# Patient Record
Sex: Female | Born: 1960 | Race: White | Hispanic: No | Marital: Married | Smoking: Former smoker
Health system: Southern US, Community
[De-identification: ages and names within clinical notes are randomized; demographics above are authoritative.]

## PROBLEM LIST (undated history)

## (undated) DIAGNOSIS — M5136 Other intervertebral disc degeneration, lumbar region: Secondary | ICD-10-CM

## (undated) DIAGNOSIS — E559 Vitamin D deficiency, unspecified: Secondary | ICD-10-CM

## (undated) DIAGNOSIS — R232 Flushing: Secondary | ICD-10-CM

## (undated) DIAGNOSIS — Z8739 Personal history of other diseases of the musculoskeletal system and connective tissue: Secondary | ICD-10-CM

## (undated) DIAGNOSIS — M51369 Other intervertebral disc degeneration, lumbar region without mention of lumbar back pain or lower extremity pain: Secondary | ICD-10-CM

## (undated) DIAGNOSIS — Z8742 Personal history of other diseases of the female genital tract: Secondary | ICD-10-CM

## (undated) DIAGNOSIS — Z87891 Personal history of nicotine dependence: Secondary | ICD-10-CM

## (undated) HISTORY — DX: Other intervertebral disc degeneration, lumbar region: M51.36

## (undated) HISTORY — DX: Flushing: R23.2

## (undated) HISTORY — DX: Other intervertebral disc degeneration, lumbar region without mention of lumbar back pain or lower extremity pain: M51.369

## (undated) HISTORY — DX: Vitamin D deficiency, unspecified: E55.9

## (undated) HISTORY — DX: Personal history of other diseases of the musculoskeletal system and connective tissue: Z87.39

## (undated) HISTORY — DX: Personal history of other diseases of the female genital tract: Z87.42

---
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
## 1998-05-22 ENCOUNTER — Other Ambulatory Visit: Admission: RE | Admit: 1998-05-22 | Discharge: 1998-05-22 | Payer: Self-pay | Admitting: *Deleted

## 1999-10-07 ENCOUNTER — Other Ambulatory Visit: Admission: RE | Admit: 1999-10-07 | Discharge: 1999-10-07 | Payer: Self-pay | Admitting: *Deleted

## 2000-11-30 ENCOUNTER — Other Ambulatory Visit: Admission: RE | Admit: 2000-11-30 | Discharge: 2000-11-30 | Payer: Self-pay | Admitting: *Deleted

## 2001-12-20 ENCOUNTER — Other Ambulatory Visit: Admission: RE | Admit: 2001-12-20 | Discharge: 2001-12-20 | Payer: Self-pay | Admitting: *Deleted

## 2002-10-04 ENCOUNTER — Observation Stay (HOSPITAL_COMMUNITY): Admission: RE | Admit: 2002-10-04 | Discharge: 2002-10-05 | Payer: Self-pay | Admitting: General Surgery

## 2002-10-04 ENCOUNTER — Encounter: Payer: Self-pay | Admitting: General Surgery

## 2002-11-17 HISTORY — PX: LAPAROSCOPIC CHOLECYSTECTOMY: SUR755

## 2003-01-02 ENCOUNTER — Other Ambulatory Visit: Admission: RE | Admit: 2003-01-02 | Discharge: 2003-01-02 | Payer: Self-pay | Admitting: *Deleted

## 2004-02-14 ENCOUNTER — Other Ambulatory Visit: Admission: RE | Admit: 2004-02-14 | Discharge: 2004-02-14 | Payer: Self-pay | Admitting: *Deleted

## 2005-06-02 ENCOUNTER — Other Ambulatory Visit: Admission: RE | Admit: 2005-06-02 | Discharge: 2005-06-02 | Payer: Self-pay | Admitting: *Deleted

## 2011-01-23 ENCOUNTER — Other Ambulatory Visit: Payer: Self-pay | Admitting: Obstetrics & Gynecology

## 2011-01-23 DIAGNOSIS — E049 Nontoxic goiter, unspecified: Secondary | ICD-10-CM

## 2011-01-31 ENCOUNTER — Other Ambulatory Visit: Payer: Self-pay

## 2011-02-07 ENCOUNTER — Other Ambulatory Visit: Payer: Self-pay

## 2011-02-11 ENCOUNTER — Other Ambulatory Visit: Payer: Self-pay

## 2011-03-13 ENCOUNTER — Ambulatory Visit
Admission: RE | Admit: 2011-03-13 | Discharge: 2011-03-13 | Disposition: A | Discharge status: Home / Self Care | Payer: 59 | Source: Ambulatory Visit | Attending: Obstetrics & Gynecology | Admitting: Obstetrics & Gynecology

## 2011-03-13 DIAGNOSIS — E049 Nontoxic goiter, unspecified: Secondary | ICD-10-CM

## 2011-03-14 ENCOUNTER — Other Ambulatory Visit: Payer: Self-pay

## 2012-02-11 LAB — HM PAP SMEAR: HM Pap smear: NEGATIVE

## 2012-05-17 HISTORY — PX: OTHER SURGICAL HISTORY: SHX169

## 2012-06-02 ENCOUNTER — Encounter (HOSPITAL_COMMUNITY): Payer: Self-pay | Admitting: Pharmacist

## 2012-06-08 ENCOUNTER — Encounter (HOSPITAL_COMMUNITY): Payer: Self-pay

## 2012-06-08 ENCOUNTER — Encounter (HOSPITAL_COMMUNITY)
Admission: RE | Admit: 2012-06-08 | Discharge: 2012-06-08 | Disposition: A | Discharge status: Home / Self Care | Payer: 59 | Source: Ambulatory Visit | Attending: Obstetrics & Gynecology | Admitting: Obstetrics & Gynecology

## 2012-06-08 HISTORY — DX: Personal history of nicotine dependence: Z87.891

## 2012-06-08 LAB — SURGICAL PCR SCREEN
MRSA, PCR: NEGATIVE
Staphylococcus aureus: NEGATIVE

## 2012-06-08 LAB — CBC
HCT: 41.3 % (ref 36.0–46.0)
Hemoglobin: 13.2 g/dL (ref 12.0–15.0)
RBC: 4.49 MIL/uL (ref 3.87–5.11)

## 2012-06-08 NOTE — Patient Instructions (Addendum)
   Your procedure is scheduled on: Monday July 29th  Enter through the Main Entrance of [facility] at: 6am Pick up the phone at the desk and dial [phone] and inform us of your arrival.  Please call this number if you have any problems the morning of surgery: [phone]  Remember: Do not eat food after midnight: Sunday Do not drink clear liquids after: midnight Sunday Take these medicines the morning of surgery with a SIP OF WATER: none  Do not wear jewelry, make-up, or FINGER nail polish No metal in your hair or on your body. Do not wear lotions, powders, perfumes or deodorant. Do not shave 48 hours prior to surgery. Do not bring valuables to the hospital. Contacts, dentures or bridgework may not be worn into surgery.  Leave suitcase in the car. After Surgery it may be brought to your room. For patients being admitted to the hospital, checkout time is 11:00am the day of discharge.  Patients discharged on the day of surgery will not be allowed to drive home.     Remember to use your hibiclens as instructed.Please shower with 1/2 bottle the evening before your surgery and the other 1/2 bottle the morning of surgery. Neck down avoiding private area.

## 2012-06-14 ENCOUNTER — Ambulatory Visit (HOSPITAL_COMMUNITY): Payer: 59 | Admitting: Anesthesiology

## 2012-06-14 ENCOUNTER — Encounter (HOSPITAL_COMMUNITY): Payer: Self-pay | Admitting: *Deleted

## 2012-06-14 ENCOUNTER — Encounter (HOSPITAL_COMMUNITY): Payer: Self-pay | Admitting: Anesthesiology

## 2012-06-14 ENCOUNTER — Ambulatory Visit (HOSPITAL_COMMUNITY)
Admission: RE | Admit: 2012-06-14 | Discharge: 2012-06-15 | Disposition: A | Discharge status: Home / Self Care | Payer: 59 | Source: Ambulatory Visit | Attending: Obstetrics & Gynecology | Admitting: Obstetrics & Gynecology

## 2012-06-14 ENCOUNTER — Encounter (HOSPITAL_COMMUNITY)
Admission: RE | Disposition: A | Discharge status: Home / Self Care | Payer: Self-pay | Source: Ambulatory Visit | Attending: Obstetrics & Gynecology

## 2012-06-14 DIAGNOSIS — N80109 Endometriosis of ovary, unspecified side, unspecified depth: Secondary | ICD-10-CM | POA: Insufficient documentation

## 2012-06-14 DIAGNOSIS — N803 Endometriosis of pelvic peritoneum: Secondary | ICD-10-CM | POA: Diagnosis present

## 2012-06-14 DIAGNOSIS — D251 Intramural leiomyoma of uterus: Secondary | ICD-10-CM | POA: Diagnosis present

## 2012-06-14 DIAGNOSIS — N920 Excessive and frequent menstruation with regular cycle: Secondary | ICD-10-CM | POA: Insufficient documentation

## 2012-06-14 DIAGNOSIS — N801 Endometriosis of ovary: Secondary | ICD-10-CM | POA: Insufficient documentation

## 2012-06-14 DIAGNOSIS — Z01818 Encounter for other preprocedural examination: Secondary | ICD-10-CM | POA: Insufficient documentation

## 2012-06-14 DIAGNOSIS — N8000 Endometriosis of the uterus, unspecified: Secondary | ICD-10-CM | POA: Insufficient documentation

## 2012-06-14 DIAGNOSIS — N938 Other specified abnormal uterine and vaginal bleeding: Principal | ICD-10-CM | POA: Insufficient documentation

## 2012-06-14 DIAGNOSIS — N949 Unspecified condition associated with female genital organs and menstrual cycle: Principal | ICD-10-CM | POA: Insufficient documentation

## 2012-06-14 DIAGNOSIS — Z01812 Encounter for preprocedural laboratory examination: Secondary | ICD-10-CM | POA: Insufficient documentation

## 2012-06-14 LAB — HEMOGLOBIN: Hemoglobin: 12.9 g/dL (ref 12.0–15.0)

## 2012-06-14 SURGERY — ROBOTIC ASSISTED TOTAL HYSTERECTOMY
Anesthesia: General | Site: Abdomen | Wound class: Clean Contaminated

## 2012-06-14 MED ORDER — KETOROLAC TROMETHAMINE 30 MG/ML IJ SOLN
INTRAMUSCULAR | Status: DC | PRN
  Administered 2012-06-14: 30 mg via INTRAVENOUS

## 2012-06-14 MED ORDER — LIDOCAINE HCL (CARDIAC) 20 MG/ML IV SOLN
INTRAVENOUS | Status: DC | PRN
  Administered 2012-06-14: 50 mg via INTRAVENOUS

## 2012-06-14 MED ORDER — NEOSTIGMINE METHYLSULFATE 1 MG/ML IJ SOLN
INTRAMUSCULAR | Status: DC | PRN
  Administered 2012-06-14: 3 mg via INTRAVENOUS

## 2012-06-14 MED ORDER — SODIUM CHLORIDE 0.9 % IJ SOLN
INTRAMUSCULAR | Status: DC | PRN
  Administered 2012-06-14: 60 mL via INTRAVENOUS

## 2012-06-14 MED ORDER — INDIGOTINDISULFONATE SODIUM 8 MG/ML IJ SOLN
INTRAMUSCULAR | Status: DC | PRN
  Administered 2012-06-14: 5 mL via INTRAVENOUS

## 2012-06-14 MED ORDER — MENTHOL 3 MG MT LOZG: 1.0000 | LOZENGE | OROMUCOSAL | Status: DC | PRN

## 2012-06-14 MED ORDER — TEMAZEPAM 15 MG PO CAPS: 15.0000 mg | ORAL_CAPSULE | Freq: Every evening | ORAL | Status: DC | PRN

## 2012-06-14 MED ORDER — ARTIFICIAL TEARS OP OINT
TOPICAL_OINTMENT | OPHTHALMIC | Status: AC
  Filled 2012-06-14: qty 3.5

## 2012-06-14 MED ORDER — INDIGOTINDISULFONATE SODIUM 8 MG/ML IJ SOLN
INTRAMUSCULAR | Status: AC
  Filled 2012-06-14: qty 5

## 2012-06-14 MED ORDER — MIDAZOLAM HCL 5 MG/5ML IJ SOLN
INTRAMUSCULAR | Status: DC | PRN
  Administered 2012-06-14: 2 mg via INTRAVENOUS

## 2012-06-14 MED ORDER — ROPIVACAINE HCL 5 MG/ML IJ SOLN
INTRAMUSCULAR | Status: AC
  Filled 2012-06-14: qty 60

## 2012-06-14 MED ORDER — CEFAZOLIN SODIUM-DEXTROSE 2-3 GM-% IV SOLR
INTRAVENOUS | Status: AC
  Filled 2012-06-14: qty 50

## 2012-06-14 MED ORDER — MIDAZOLAM HCL 2 MG/2ML IJ SOLN
INTRAMUSCULAR | Status: AC
  Filled 2012-06-14: qty 2

## 2012-06-14 MED ORDER — KETOROLAC TROMETHAMINE 30 MG/ML IJ SOLN
30.0000 mg | Freq: Four times a day (QID) | INTRAMUSCULAR | Status: DC
  Administered 2012-06-14 – 2012-06-15 (×4): 30 mg via INTRAVENOUS
  Filled 2012-06-14 (×4): qty 1

## 2012-06-14 MED ORDER — SIMETHICONE 80 MG PO CHEW: 80.0000 mg | CHEWABLE_TABLET | Freq: Four times a day (QID) | ORAL | Status: DC | PRN

## 2012-06-14 MED ORDER — PROMETHAZINE HCL 25 MG/ML IJ SOLN
12.5000 mg | INTRAMUSCULAR | Status: DC | PRN
  Administered 2012-06-14: 12.5 mg via INTRAVENOUS
  Filled 2012-06-14: qty 1

## 2012-06-14 MED ORDER — FENTANYL CITRATE 0.05 MG/ML IJ SOLN
INTRAMUSCULAR | Status: AC
  Filled 2012-06-14: qty 2

## 2012-06-14 MED ORDER — DEXTROSE-NACL 5-0.45 % IV SOLN
INTRAVENOUS | Status: DC
  Administered 2012-06-14: 12:00:00 via INTRAVENOUS

## 2012-06-14 MED ORDER — ONDANSETRON HCL 4 MG/2ML IJ SOLN
INTRAMUSCULAR | Status: AC
  Filled 2012-06-14: qty 2

## 2012-06-14 MED ORDER — ONDANSETRON HCL 4 MG/2ML IJ SOLN
INTRAMUSCULAR | Status: DC | PRN
  Administered 2012-06-14: 4 mg via INTRAVENOUS

## 2012-06-14 MED ORDER — PROPOFOL 10 MG/ML IV EMUL
INTRAVENOUS | Status: AC
  Filled 2012-06-14: qty 20

## 2012-06-14 MED ORDER — LIDOCAINE HCL (CARDIAC) 20 MG/ML IV SOLN
INTRAVENOUS | Status: AC
  Filled 2012-06-14: qty 5

## 2012-06-14 MED ORDER — PHENYLEPHRINE HCL 10 MG/ML IJ SOLN
INTRAMUSCULAR | Status: DC | PRN
  Administered 2012-06-14: 0.1 mg via INTRAVENOUS

## 2012-06-14 MED ORDER — SCOPOLAMINE 1 MG/3DAYS TD PT72
MEDICATED_PATCH | TRANSDERMAL | Status: AC
  Administered 2012-06-14: 1.5 mg via TRANSDERMAL
  Filled 2012-06-14: qty 1

## 2012-06-14 MED ORDER — OXYCODONE-ACETAMINOPHEN 5-325 MG PO TABS: 1.0000 | ORAL_TABLET | ORAL | Status: DC | PRN

## 2012-06-14 MED ORDER — PHENYLEPHRINE 40 MCG/ML (10ML) SYRINGE FOR IV PUSH (FOR BLOOD PRESSURE SUPPORT)
PREFILLED_SYRINGE | INTRAVENOUS | Status: AC
  Filled 2012-06-14: qty 5

## 2012-06-14 MED ORDER — ALUM & MAG HYDROXIDE-SIMETH 200-200-20 MG/5ML PO SUSP: 30.0000 mL | ORAL | Status: DC | PRN

## 2012-06-14 MED ORDER — DEXAMETHASONE SODIUM PHOSPHATE 4 MG/ML IJ SOLN
INTRAMUSCULAR | Status: DC | PRN
  Administered 2012-06-14: 8 mg via INTRAVENOUS

## 2012-06-14 MED ORDER — HYDROMORPHONE HCL PF 1 MG/ML IJ SOLN
0.2500 mg | INTRAMUSCULAR | Status: DC | PRN
  Administered 2012-06-14: 0.5 mg via INTRAVENOUS

## 2012-06-14 MED ORDER — SCOPOLAMINE 1 MG/3DAYS TD PT72
1.0000 | MEDICATED_PATCH | TRANSDERMAL | Status: DC
  Administered 2012-06-14: 1.5 mg via TRANSDERMAL

## 2012-06-14 MED ORDER — GLYCOPYRROLATE 0.2 MG/ML IJ SOLN
INTRAMUSCULAR | Status: AC
  Filled 2012-06-14: qty 1

## 2012-06-14 MED ORDER — ROCURONIUM BROMIDE 50 MG/5ML IV SOLN
INTRAVENOUS | Status: AC
  Filled 2012-06-14: qty 1

## 2012-06-14 MED ORDER — ROPIVACAINE HCL 5 MG/ML IJ SOLN
INTRAMUSCULAR | Status: DC | PRN
  Administered 2012-06-14: 60 mL via EPIDURAL

## 2012-06-14 MED ORDER — PROPOFOL 10 MG/ML IV EMUL
INTRAVENOUS | Status: DC | PRN
  Administered 2012-06-14: 200 mg via INTRAVENOUS

## 2012-06-14 MED ORDER — ACETAMINOPHEN 325 MG PO TABS: 650.0000 mg | ORAL_TABLET | ORAL | Status: DC | PRN

## 2012-06-14 MED ORDER — HYDROMORPHONE HCL PF 1 MG/ML IJ SOLN
INTRAMUSCULAR | Status: AC
Start: 2012-06-14 — End: 2012-06-14
  Administered 2012-06-14: 0.5 mg via INTRAVENOUS
  Filled 2012-06-14: qty 1

## 2012-06-14 MED ORDER — PANTOPRAZOLE SODIUM 40 MG IV SOLR
40.0000 mg | Freq: Every day | INTRAVENOUS | Status: DC
  Administered 2012-06-14: 40 mg via INTRAVENOUS
  Filled 2012-06-14 (×2): qty 40

## 2012-06-14 MED ORDER — KETOROLAC TROMETHAMINE 30 MG/ML IJ SOLN
INTRAMUSCULAR | Status: AC
  Filled 2012-06-14: qty 1

## 2012-06-14 MED ORDER — CEFAZOLIN SODIUM-DEXTROSE 2-3 GM-% IV SOLR
2.0000 g | INTRAVENOUS | Status: AC
  Administered 2012-06-14: 2 g via INTRAVENOUS

## 2012-06-14 MED ORDER — GLYCOPYRROLATE 0.2 MG/ML IJ SOLN
INTRAMUSCULAR | Status: DC | PRN
  Administered 2012-06-14: .5 mg via INTRAVENOUS
  Administered 2012-06-14: .15 mg via INTRAVENOUS

## 2012-06-14 MED ORDER — MORPHINE SULFATE 4 MG/ML IJ SOLN: 2.0000 mg | INTRAMUSCULAR | Status: DC | PRN

## 2012-06-14 MED ORDER — FENTANYL CITRATE 0.05 MG/ML IJ SOLN
INTRAMUSCULAR | Status: DC | PRN
  Administered 2012-06-14: 150 ug via INTRAVENOUS
  Administered 2012-06-14: 25 ug via INTRAVENOUS
  Administered 2012-06-14: 100 ug via INTRAVENOUS
  Administered 2012-06-14: 25 ug via INTRAVENOUS

## 2012-06-14 MED ORDER — ROCURONIUM BROMIDE 100 MG/10ML IV SOLN
INTRAVENOUS | Status: DC | PRN
  Administered 2012-06-14: 50 mg via INTRAVENOUS

## 2012-06-14 MED ORDER — LACTATED RINGERS IR SOLN
Status: DC | PRN
  Administered 2012-06-14: 1

## 2012-06-14 MED ORDER — NEOSTIGMINE METHYLSULFATE 1 MG/ML IJ SOLN
INTRAMUSCULAR | Status: AC
  Filled 2012-06-14: qty 10

## 2012-06-14 MED ORDER — DEXAMETHASONE SODIUM PHOSPHATE 10 MG/ML IJ SOLN
INTRAMUSCULAR | Status: AC
  Filled 2012-06-14: qty 1

## 2012-06-14 MED ORDER — KETOROLAC TROMETHAMINE 30 MG/ML IJ SOLN: 30.0000 mg | Freq: Four times a day (QID) | INTRAMUSCULAR | Status: DC

## 2012-06-14 MED ORDER — FENTANYL CITRATE 0.05 MG/ML IJ SOLN
INTRAMUSCULAR | Status: AC
  Filled 2012-06-14: qty 5

## 2012-06-14 MED ORDER — ONDANSETRON HCL 4 MG/2ML IJ SOLN: 4.0000 mg | Freq: Four times a day (QID) | INTRAMUSCULAR | Status: DC | PRN

## 2012-06-14 MED ORDER — LACTATED RINGERS IV SOLN
INTRAVENOUS | Status: DC
  Administered 2012-06-14: 07:00:00 via INTRAVENOUS

## 2012-06-14 SURGICAL SUPPLY — 58 items
ADH SKN CLS APL DERMABOND .7 (GAUZE/BANDAGES/DRESSINGS) ×2
APL SKNCLS STERI-STRIP NONHPOA (GAUZE/BANDAGES/DRESSINGS) ×2
BAG URINE DRAINAGE (UROLOGICAL SUPPLIES) ×3 IMPLANT
BARRIER ADHS 3X4 INTERCEED (GAUZE/BANDAGES/DRESSINGS) ×4 IMPLANT
BENZOIN TINCTURE PRP APPL 2/3 (GAUZE/BANDAGES/DRESSINGS) ×3 IMPLANT
BRR ADH 4X3 ABS CNTRL BYND (GAUZE/BANDAGES/DRESSINGS) ×4
CABLE HIGH FREQUENCY MONO STRZ (ELECTRODE) ×3 IMPLANT
CHLORAPREP W/TINT 26ML (MISCELLANEOUS) ×3 IMPLANT
CLOTH BEACON ORANGE TIMEOUT ST (SAFETY) ×3 IMPLANT
CONT PATH 16OZ SNAP LID 3702 (MISCELLANEOUS) ×3 IMPLANT
COVER MAYO STAND STRL (DRAPES) ×3 IMPLANT
COVER TABLE BACK 60X90 (DRAPES) ×6 IMPLANT
COVER TIP SHEARS 8 DVNC (MISCELLANEOUS) ×2 IMPLANT
COVER TIP SHEARS 8MM DA VINCI (MISCELLANEOUS) ×1
DECANTER SPIKE VIAL GLASS SM (MISCELLANEOUS) ×4 IMPLANT
DERMABOND ADVANCED (GAUZE/BANDAGES/DRESSINGS) ×1
DERMABOND ADVANCED .7 DNX12 (GAUZE/BANDAGES/DRESSINGS) ×2 IMPLANT
DRAPE HUG U DISPOSABLE (DRAPE) ×3 IMPLANT
DRAPE LG THREE QUARTER DISP (DRAPES) ×6 IMPLANT
DRAPE WARM FLUID 44X44 (DRAPE) ×3 IMPLANT
ELECT REM PT RETURN 9FT ADLT (ELECTROSURGICAL) ×3
ELECTRODE REM PT RTRN 9FT ADLT (ELECTROSURGICAL) ×2 IMPLANT
EVACUATOR SMOKE 8.L (FILTER) ×3 IMPLANT
GAUZE VASELINE 3X9 (GAUZE/BANDAGES/DRESSINGS) IMPLANT
GLOVE BIOGEL PI IND STRL 7.0 (GLOVE) ×4 IMPLANT
GLOVE BIOGEL PI INDICATOR 7.0 (GLOVE) ×2
GLOVE ECLIPSE 6.5 STRL STRAW (GLOVE) ×11 IMPLANT
GOWN STRL REIN XL XLG (GOWN DISPOSABLE) ×18 IMPLANT
KIT ACCESSORY DA VINCI DISP (KITS) ×1
KIT ACCESSORY DVNC DISP (KITS) ×2 IMPLANT
NDL INSUFFLATION 14GA 120MM (NEEDLE) ×2 IMPLANT
NEEDLE INSUFFLATION 14GA 120MM (NEEDLE) ×3 IMPLANT
OCCLUDER COLPOPNEUMO (BALLOONS) ×1 IMPLANT
PACK LAVH (CUSTOM PROCEDURE TRAY) ×3 IMPLANT
PAD PREP 24X48 CUFFED NSTRL (MISCELLANEOUS) ×6 IMPLANT
PLUG CATH AND CAP STER (CATHETERS) ×3 IMPLANT
PROTECTOR NERVE ULNAR (MISCELLANEOUS) ×6 IMPLANT
SET CYSTO W/LG BORE CLAMP LF (SET/KITS/TRAYS/PACK) ×3 IMPLANT
SET IRRIG TUBING LAPAROSCOPIC (IRRIGATION / IRRIGATOR) ×3 IMPLANT
SOLUTION ELECTROLUBE (MISCELLANEOUS) ×3 IMPLANT
STRIP CLOSURE SKIN 1/4X4 (GAUZE/BANDAGES/DRESSINGS) ×3 IMPLANT
SUT VIC AB 0 CT1 27 (SUTURE) ×6
SUT VIC AB 0 CT1 27XBRD ANBCTR (SUTURE) ×10 IMPLANT
SUT VICRYL 0 UR6 27IN ABS (SUTURE) ×3 IMPLANT
SUT VICRYL RAPIDE 4/0 PS 2 (SUTURE) ×6 IMPLANT
SUT VLOC 180 0 9IN  GS21 (SUTURE) ×1
SUT VLOC 180 0 9IN GS21 (SUTURE) IMPLANT
SYR 50ML LL SCALE MARK (SYRINGE) ×3 IMPLANT
SYSTEM CONVERTIBLE TROCAR (TROCAR) IMPLANT
TIP UTERINE 6.7X8CM BLUE DISP (MISCELLANEOUS) ×1 IMPLANT
TOWEL OR 17X24 6PK STRL BLUE (TOWEL DISPOSABLE) ×6 IMPLANT
TROCAR DISP BLADELESS 8 DVNC (TROCAR) ×2 IMPLANT
TROCAR DISP BLADELESS 8MM (TROCAR) ×1
TROCAR XCEL NON-BLD 5MMX100MML (ENDOMECHANICALS) ×3 IMPLANT
TROCAR Z-THREAD 12X150 (TROCAR) ×3 IMPLANT
TUBING FILTER THERMOFLATOR (ELECTROSURGICAL) ×3 IMPLANT
WARMER LAPAROSCOPE (MISCELLANEOUS) ×3 IMPLANT
WATER STERILE IRR 1000ML POUR (IV SOLUTION) ×9 IMPLANT

## 2012-06-14 NOTE — Anesthesia Postprocedure Evaluation (Signed)
  Anesthesia Post-op Note  Patient: Shirley Conley  Procedure(s) Performed: Procedure(s) (LRB): ROBOTIC ASSISTED TOTAL HYSTERECTOMY (N/A) ROBOTIC ASSISTED BILATERAL SALPINGO OOPHERECTOMY (Bilateral)  Patient Location: [facility]  Anesthesia Type: General  Level of Consciousness: sedated  Airway and Oxygen Therapy: Patient Spontanous Breathing and Patient connected to nasal cannula oxygen  Post-op Pain: mild  Post-op Assessment: Post-op Vital signs reviewed  Post-op Vital Signs: Reviewed and stable  Complications: No apparent anesthesia complications

## 2012-06-14 NOTE — Addendum Note (Signed)
Addendum  created 06/14/12 1800 by Algis Greenhouse, CRNA   Modules edited:Notes Section

## 2012-06-14 NOTE — H&P (Signed)
Shirley Conley is an 51 y.o. female with DUB and uterine fibroids who has decided to proceed with definitive management.  She has been on Micronor without any success in treatment.  She has declined ablation and IUD use.  She does desire ovarian removal as well.  Pertinent Gynecological History: Menses: irregular and heavy at times Bleeding: dysfunctional uterine bleeding Contraception: micronor DES exposure: denies Blood transfusions: none Sexually transmitted diseases: no past history Previous GYN Procedures: none  Last mammogram: normal Date: 5/12  Last pap: normal Date: 3/13 OB History: G1, P1   Menstrual History: Menarche age: 72 or 59 No LMP recorded.    Past Medical History  Diagnosis Date  . No pertinent past medical history   . PONV (postoperative nausea and vomiting)   . History of smoking     Past Surgical History  Procedure Date  . Cholecystectomy 2004    No family history on file.  Social History:  reports that she has been smoking.  She does not have any smokeless tobacco history on file. She reports that she does not drink alcohol or use illicit drugs.  Allergies:  Allergies  Allergen Reactions  . Codeine Nausea And Vomiting    Prescriptions prior to admission  Medication Sig Dispense Refill  . doxylamine, Sleep, (SLEEP AID) 25 MG tablet Take 25 mg by mouth at bedtime as needed. For sleep      . norethindrone (MICRONOR,CAMILA,ERRIN) 0.35 MG tablet Take 1 tablet by mouth daily.      . Multiple Vitamins-Minerals (MULTIVITAMIN WITH MINERALS) tablet Take 1 tablet by mouth daily.        Review of Systems  Constitutional: Negative for fever and chills.  Eyes: Negative for blurred vision.  Respiratory: Negative for cough.   Cardiovascular: Negative for chest pain and palpitations.  Gastrointestinal: Negative for heartburn, nausea, vomiting and abdominal pain.  Genitourinary: Negative for dysuria and urgency.  Musculoskeletal: Negative for myalgias.    Skin: Negative for itching and rash.  Neurological: Negative for dizziness and headaches.  Endo/Heme/Allergies: Does not bruise/bleed easily.  Psychiatric/Behavioral: Negative for depression.    Blood pressure 107/55, pulse 72, temperature 98.6 F (37 C), temperature source Oral, resp. rate 18, SpO2 98.00%. Physical Exam  Vitals reviewed. Constitutional: She is oriented to person, place, and time. She appears well-developed and well-nourished.  HENT:  Head: Normocephalic and atraumatic.  Neck: Normal range of motion. Neck supple.  Cardiovascular: Normal rate and regular rhythm.   Respiratory: Effort normal and breath sounds normal.  GI: Soft. Bowel sounds are normal.  Musculoskeletal: Normal range of motion.  Neurological: She is alert and oriented to person, place, and time.  Skin: Skin is warm and dry.  Psychiatric: She has a normal mood and affect.    Results for orders placed during the hospital encounter of 06/14/12 (from the past 24 hour(s))  PREGNANCY, URINE     Status: Normal   Collection Time   06/14/12  6:30 AM      Component Value Range   Preg Test, Ur NEGATIVE  NEGATIVE    No results found.  Assessment/Plan: 51 year old G1P1 MWF with DUB and uterine fibroids who is here for definitive management with robotic assisted TLH/BSO.  Risks and benefits reviewed.  Documentation is in my office chart.  All questions answered.  Patient here and ready to proceed.  Valentina Shaggy SUZANNE 06/14/2012, 7:04 AM

## 2012-06-14 NOTE — Op Note (Signed)
06/14/2012  9:09 AM  PATIENT:  Shirley Conley  51 y.o. female G1P1  PRE-OPERATIVE DIAGNOSIS:  Dysfunctonal Uterine Bleeding, Fibroids, and Menorrhagia  POST-OPERATIVE DIAGNOSIS:  Dysfunctonal Uterine Bleeding, Fibroids, Menorrhagia, endometriosis  PROCEDURE:  Procedure(s): ROBOTIC ASSISTED TOTAL HYSTERECTOMY ROBOTIC ASSISTED BILATERAL SALPINGO OOPHERECTOMY CYSTOSCOPY  SURGEON:  Muhsin Doris SUZANNE  ASSISTANTS: ROMINE, CYNTHIA   ANESTHESIA:   general  ESTIMATED BLOOD LOSS: 50cc  BLOOD ADMINISTERED:none   FLUIDS: 1400cc LR  UOP: 150cc clear UPO3  SPECIMEN:  Uterus, cervix, bilateral tubes and ovaries  DISPOSITION OF SPECIMEN:  PATHOLOGY  FINDINGS: adhesions of colon to left sidewall, endometriosis of posterior peritoneum near the left uterosacral ligament and scarring of the surrounding peritoneal tissue, uterine fibroids, adhesions of the right colon/cecum to the right sidewall (history of cholecystectomy  DESCRIPTION OF OPERATION: Patient was taken to the operating room. She is placed in the supine position. She was positioned on the beanbag. While she was awake her legs are positioned in the low lithotomy position. Sequential compression devices were on her lower extremities and functioning properly. The legs are placed in the Norris stirrups. Her elbows and hands were padded and tucked by her side. She was comfortableness position. Then general endotracheal anesthesia was administered by the anesthesia staff without difficulty. Dr. Arby Barrette oversaw case. Then suction was applied to the beanbag which was brought up around her shoulders and arms to prevent her from sliding are moving during the procedure.  Timeout was performed. The abdomen was then prepped with chlor prep the perineum inner thighs and vagina were then prepped with Betadine x3. A chlor prep was allowed to dry for 3 minutes before she was draped in a normal standard fashion. Legs were then lifted to the high  lithotomy position. Attention was turned to the vagina. A heavy weighted speculum was placed in the vagina and the cervix was visualized. The anterior lip of the cervix was grasped with a single-tooth tenaculum. The uterus sounded to 8 cm. The cervix was then dilated with Shawnie Pons dilators up to #21. An RUMI uterine manipulator was obtained. A #8 disposable tip is attached to this manipulator as well as a vaginal occlusive device. The bulb on the tip of the 8mm disposable tip was functioning properly. A small KOH ring was placed on the RUMI uterine manipulator. This device was then passed to the endocervical canal. There is good fit of the KOH ring around the cervix. There was also good manipulation of the uterus. The bulb was inflated. The tenaculum and heavy weighted speculum was removed from the vagina. A Foley catheter is placed in the bladder to straight drain and the legs were lowered to the low lithotomy position. Attention was turned abdomen.  The skin just above the umbilicus was anesthetized with quarter percent Marcaine. Then a 10 mm skin incision was made with a #11 blade. The subcutaneous fat and tissue was dissected. The abdomen was elevated and a Veress needle was inserted directly into the abdomen. The peritoneum was felt as a pop as the peritoneum was passed through. A syringe was attached the needle and aspiration was performed. No blood or fluid was noted. Fluid injected easily into the syringe and a second aspiration was performed which revealed no blood, fluid, or saline. Then under low flows of CO2 gas which was attached the needle and the pneumoperitoneum was achieved without difficulty. Once 3 L of gas was in the abdomen a varies needle was removed. A #11 bladed trocar port were then inserted  directly into the abdomen. The laparoscope was used to confirm adequate placement. The upper as abdomen was surveyed. The liver appeared normal. There is no gallbladder. There was an enlarged stomach  which did appear to have some air in it. An OG tube was inserted and the stomach decompressed. 60 cc of ropivacaine mixture (0.5% mixed one-to-one with normal saline) was placed in the pelvis. Then the abdominal wall was transilluminated and port location sites for #1 #2 arm of the robot were chosen. These were made about 10 mm just lateral to the umbilicus. Quarter percent Marcaine was used to anesthetize the skin. 8mm skin incisions were made with a #11 blade. An 8 nondisposable trocar port was inserted in each of these incisions. The  location for the #1 arm was to the right of the incision and the #2 arm was to the left of the incision. Then a port location sites chosen for a 5 mm right lower quadrant incision. The same Marcaine solution was used anesthetize the skin 5 mm skin incision was made with a #11 blade. A 5 mm non-bladed trocar port were inserted under direct visualization. Care was taken to note the location of the adhesions cecum on the right side of the abdomen. At this point the patient table was placed on the floor and the patient was placed in steep Trendelenburg positioning. The robot was docked on the left. A PK [place] bipolar cautery was inserted arm #2 and endoscopic scissor with monopolar cautery was inserted to arm #1.   I left the bedside with my partner still at the bedside and went to the console.  The uterus was placed on stretch to the left and the right ureter was noted. The patient her desire tubes and ovaries to be removed to the right IP ligament this point was serially clamped cauterized and incised. The right round ligament was serially clamped cauterized and incised. The inferiorly for the broad ligament was opened with the anterior peritoneum taken down to the level of the internal os in the mid region of the cervix and a posterior peritoneum was taken down to the uterosacral ligament on the left side. The bladder flap was created by dissecting in the pubovesicocervical  fascial plane. The bladder was taken down well below the level of the KOH ring. The right uterine artery was skeletonized and visualized very well and at the superior edge of the KOH ring the uterine artery was serially clamped cauterized and incised. Excellent hemostasis was present on this right side in all pedicles.  Attention was then turned to the left side with the uterus on stretch to the right. There were some bowel adhesions to the left IP ligament. These were filmy adhesions these were taken down sharply. The ureter was seen peristalsing normally. The left IP ligament was then serially clamped cauterized and incised and the left round window serially clamped cauterized and incised. The posterior leaf of the broad ligament was then opened with the anterior incision the incision done on the right side in the mid region of the cervix. The posterior peritoneum was also taken down to the level of the uterosacral ligament on the left side. The remainder of the bladder flap was created and the uterine artery on the left side was skeletonized. Then at the superior edge of the KOH ring the left uterine artery was serially clamped cauterized and ultimately incised. The uterus was blanch at this time. There was excellent hemostasis along pedicles on the left side.  The colpotomy was initiated and the midline. This incision was kept on the superior edge of the KOH ring and was carried around the cervix as a circumferential fashion. This cauterization was done with monopolar cautery and an open blade of the scissors. Once the colpotomy was completed the cervix uterus tubes and ovaries were delivered out the vagina. A vaginal occlusive device was then used to maintain excellent hemostasis. Then the instruments were changed and a needle driver was placed in arm or 1 and a Cobra grasper was placed in arm #2. Using a 90 day the lock suture a starting at the right corner, the vaginal cuff was closed using a running  stitch. The left corner was incorporated the stitch with good visualization. The stitch was then brought back 2 more times the opposite direction before the suture was cut flush with the vagina. There is excellent hemostasis along vaginal cuff. The [place] suction irrigator was then used to irrigate the pelvis and irrigate any clot out of the pelvis there was only a small amount of bleeding that came from the vaginal cuff at the colpotomy was performed.  Interceed was placed across vaginal cuff. The ureters were seen peristalsing normally on each side of the pelvis. The instruments were removed from the pelvis and robot was undocked. The patient was taken out of Trendelenburg positioning and under direct visualization of the laparoscope with #1 and #2 ports in the right lower quadrant port were removed. The pneumoperitoneum was relieved. The CRNA give the patient several deep breaths to try and get any additional gas the abdomen before the midline port was removed. The midline port was removed. The umbilical incision was closed with figure-of-eight suture of #0 Vicryl. The skin was cleansed and using a subcuticular stitch of #3-0 Vicryl incisions were closed.  The incisions were cleansed and Dermabond was applied to skin.  An amp of indigo carmine was given intravenously by the CRNA. Then using a 30 cystoscope a cystoscopy was performed. The bladder was completely intact and there were no stitches into the bladder. The bladder was seen from the inferior side although it the done on the right and on the left side. Blue urine was seen  ejecting from each ureteral orifice.  The irrigant was drained from the bladder and the cystoscope was removed. Sponge, laps, needles, and instrument counts were correct x2. The vagina was visualized the cuff was intact there small clot the cuff which was removed with a sponge stick. At this point the patient's legs are placed back in the supine position. She was awakened anesthesia  and extubated without difficulty. She was and taken to the recovery room in stable condition.  COUNTS:  YES  PLAN OF CARE: Transfer to PACU

## 2012-06-14 NOTE — Progress Notes (Signed)
Subjective: Patient reports incisional pain, tolerating PO and no problems voiding.  Nausea earlier is now gone.  Tolerated dinner.  Objective: I have reviewed patient's vital signs, intake and output, medications and labs.  General: alert and cooperative Resp: clear to auscultation bilaterally Cardio: regular rate and rhythm, S1, S2 normal, no murmur, click, rub or gallop GI: soft , appropriately tender, non distended, occ BS Extremities: extremities normal, atraumatic, no cyanosis or edema Vaginal Bleeding: none INC:  clean, dry, intact   Assessment/Plan:  LOS: 0 days  Progressing well.  Hopeful discharge in AM.  Shirley Conley, Manuelito Poage SUZANNE 06/14/2012, 7:09 PM

## 2012-06-14 NOTE — Anesthesia Preprocedure Evaluation (Signed)
Anesthesia Evaluation  Patient identified by MRN, date of birth, ID band Patient awake    Reviewed: Allergy & Precautions, H&P , Patient's Chart, lab work & pertinent test results, reviewed documented beta blocker date and time   History of Anesthesia Complications (+) PONV  Airway Mallampati: II TM Distance: >3 FB Neck ROM: full    Dental No notable dental hx.    Pulmonary  breath sounds clear to auscultation  Pulmonary exam normal       Cardiovascular Rhythm:regular Rate:Normal     Neuro/Psych    GI/Hepatic   Endo/Other    Renal/GU      Musculoskeletal   Abdominal   Peds  Hematology   Anesthesia Other Findings   Reproductive/Obstetrics                           Anesthesia Physical Anesthesia Plan  ASA: II  Anesthesia Plan: General   Post-op Pain Management:    Induction: Intravenous  Airway Management Planned: Oral ETT  Additional Equipment:   Intra-op Plan:   Post-operative Plan:   Informed Consent: I have reviewed the patients History and Physical, chart, labs and discussed the procedure including the risks, benefits and alternatives for the proposed anesthesia with the patient or authorized representative who has indicated his/her understanding and acceptance.   Dental Advisory Given and Dental advisory given  Plan Discussed with: CRNA and Surgeon  Anesthesia Plan Comments: (  Discussed  general anesthesia, including possible nausea, instrumentation of airway, sore throat,pulmonary aspiration, etc. I asked if the were any outstanding questions, or  concerns before we proceeded. )        Anesthesia Quick Evaluation  

## 2012-06-14 NOTE — Anesthesia Postprocedure Evaluation (Signed)
Anesthesia Post Note  Patient: Shirley Conley  Procedure(s) Performed: Procedure(s) (LRB): ROBOTIC ASSISTED TOTAL HYSTERECTOMY (N/A) ROBOTIC ASSISTED BILATERAL SALPINGO OOPHERECTOMY (Bilateral)  Anesthesia type: General  Patient location: PACU  Post pain: Pain level controlled  Post assessment: Post-op Vital signs reviewed  Last Vitals:  Filed Vitals:   06/14/12 0625  BP: 107/55  Pulse: 72  Temp: 37 C  Resp: 18    Post vital signs: Reviewed  Level of consciousness: sedated  Complications: No apparent anesthesia complicationsfj

## 2012-06-14 NOTE — Transfer of Care (Signed)
Immediate Anesthesia Transfer of Care Note  Patient: KALLISTA PAE  Procedure(s) Performed: Procedure(s) (LRB): ROBOTIC ASSISTED TOTAL HYSTERECTOMY (N/A) ROBOTIC ASSISTED BILATERAL SALPINGO OOPHERECTOMY (Bilateral)  Patient Location: PACU  Anesthesia Type: General  Level of Consciousness: awake, alert  and oriented  Airway & Oxygen Therapy: Patient Spontanous Breathing and Patient connected to nasal cannula oxygen  Post-op Assessment: Report given to PACU RN and Post -op Vital signs reviewed and stable  Post vital signs: Reviewed and stable  Complications: No apparent anesthesia complications

## 2012-06-15 MED ORDER — PROMETHAZINE HCL 12.5 MG PO TABS: 25.0000 mg | ORAL_TABLET | Freq: Four times a day (QID) | ORAL | Status: DC | PRN

## 2012-06-15 MED ORDER — ESTRADIOL 0.025 MG/24HR TD PTTW: 1.0000 | MEDICATED_PATCH | TRANSDERMAL | Status: DC

## 2012-06-15 NOTE — Progress Notes (Signed)
1 Day Post-Op Procedure(s) (LRB): ROBOTIC ASSISTED TOTAL HYSTERECTOMY (N/A) ROBOTIC ASSISTED BILATERAL SALPINGO OOPHERECTOMY (Bilateral)  Subjective: Patient reports tolerating PO, + flatus and no problems voiding.  Ambulating.  Has had no nausea.  Objective: I have reviewed patient's vital signs, intake and output, medications and labs.  General: alert and cooperative Resp: clear to auscultation bilaterally Cardio: regular rate and rhythm, S1, S2 normal, no murmur, click, rub or gallop GI: soft, non-tender; bowel sounds normal; no masses,  no organomegaly and incision: clean, dry and intact Extremities: extremities normal, atraumatic, no cyanosis or edema Vaginal Bleeding: none  Assessment: s/p Procedure(s) (LRB): ROBOTIC ASSISTED TOTAL HYSTERECTOMY (N/A) ROBOTIC ASSISTED BILATERAL SALPINGO OOPHERECTOMY (Bilateral): stable and progressing well  Plan: Discharge home  LOS: 1 day    Valentina Shaggy SUZANNE 06/15/2012, 7:53 AM

## 2012-06-15 NOTE — Discharge Summary (Signed)
Physician Discharge Summary  Patient ID: Shirley Conley MRN: [id number] DOB/AGE: 05/02/1961 50 y.o.  Admit date: 06/14/2012 Discharge date: 06/15/2012  Admission Diagnoses: DUB, Fibroids  Discharge Diagnoses: Above and endometriosis Active Problems:  * No active hospital problems. *    Discharged Condition: stable  Hospital Course: Patient admitted through same day surgery.  Robotic assisted TLH/BSO, cystoscopy performed without difficulty.  EBL <50cc.  Patient transferred to PACU and from there to the third floor.  She did have significant nausea and vomiting with being transferred to the floor.  She was initially treated with phenergan which worked very well for her.  She was able to void without difficulty and advance to regular diet.  She was seen in the evening of POD#0.  She had stable vital signs and she was afebrile.  She stayed overnight because of the amount of nausea she experienced post operatively.  In the AM of POD#1, she was ambulating, passing flatus, tolerating regular diet, and voiding well.  She had used only Toradol for post op pain management.  She was meeting all criteria for discharge.  FINDINGS at surgery:  Fibroid uterus, adhesions of the colon to both the right and left sidewalls.  Endometriosis of the left uterosacral ligament.  Consults: None  Significant Diagnostic Studies: labs: post op hb 12.9  Treatments: surgery: robotic TLH/BSO, cystoscopy  Discharge Exam: Blood pressure 95/59, pulse 59, temperature 98 F (36.7 C), temperature source Oral, resp. rate 18, height 5\' 1"  (1.549 m), weight 58.968 kg (130 lb), SpO2 99.00%. General appearance: alert and cooperative Resp: clear to auscultation bilaterally Cardio: regular rate and rhythm, S1, S2 normal, no murmur, click, rub or gallop GI: soft, non-tender; bowel sounds normal; no masses,  no organomegaly Extremities: extremities normal, atraumatic, no cyanosis or  edema Incision/Wound:clean/dry/intact  Disposition: Discharge to Home with family.   Medication List  As of 06/15/2012  8:01 AM   STOP taking these medications         norethindrone 0.35 MG tablet         TAKE these medications         estradiol 0.025 MG/24HR   Commonly known as: VIVELLE-DOT   Place 1 patch onto the skin 2 (two) times a week.      multivitamin with minerals tablet   Take 1 tablet by mouth daily.      promethazine 12.5 MG tablet   Commonly known as: PHENERGAN   Take 2 tablets (25 mg total) by mouth every 6 (six) hours as needed for nausea (can take 1/2 tab if desired).      SLEEP AID 25 MG tablet   Generic drug: doxylamine (Sleep)   Take 25 mg by mouth at bedtime as needed. For sleep             Signed: Annamaria Boots 06/15/2012, 8:01 AM

## 2012-06-15 NOTE — Progress Notes (Signed)
Pt is discharged in the care of husband. Stable. Spirits are good. Abdominal operative site is dry and intact.  Denies pain or discomfort. Discharge instruction were given to pt. Pt understood all instructions well. Questions asked and answered.

## 2012-06-23 IMAGING — US US SOFT TISSUE HEAD/NECK
1 series · 14 of 25 positions shown · non-contrast
Comparison: None.

CLINICAL DATA: Prominent thyroid on physical exam

THYROID ULTRASOUND
TECHNIQUE: Ultrasound examination of the thyroid gland and adjacent
soft tissues was performed.

[Series 1: us soft tissue head/neck · 0.06mm/px · 14 of 30 slices shown]
[im 1/30]
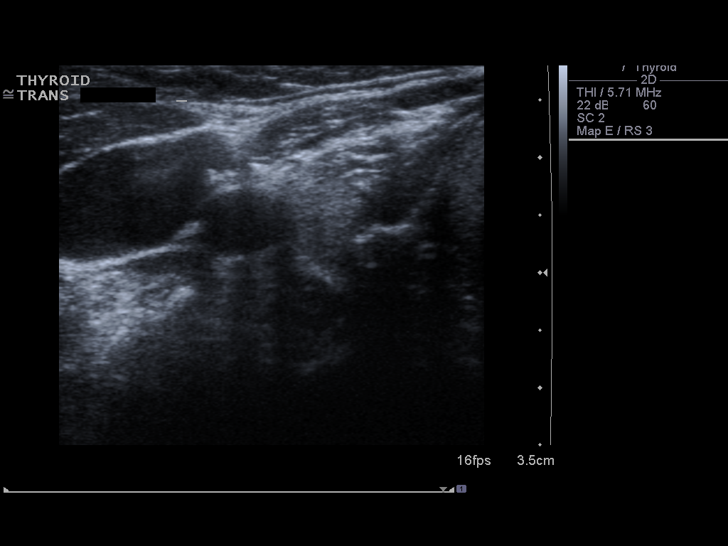
[im 3/30]
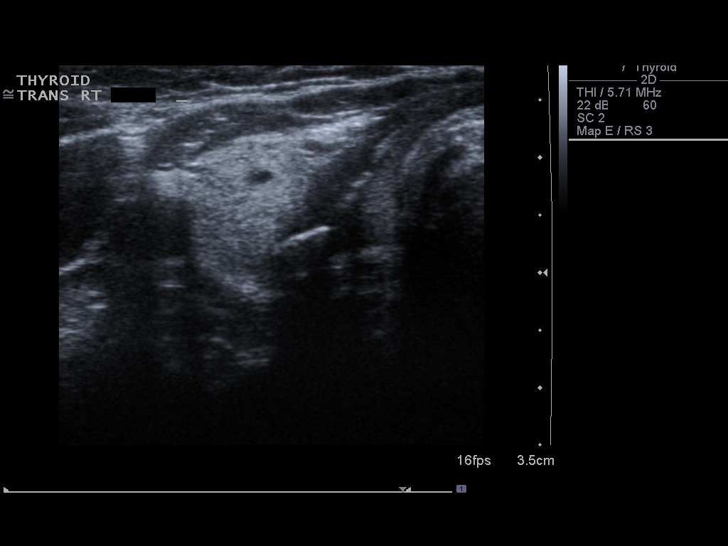
[im 5/30]
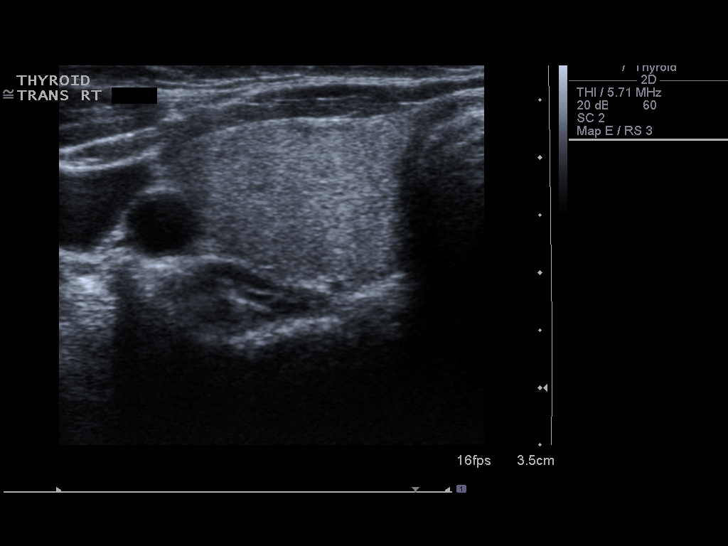
[im 8/30]
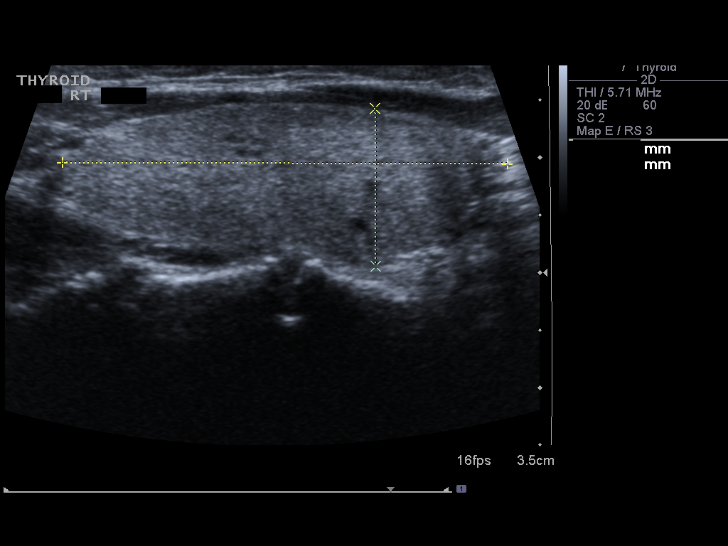
[im 10/30]
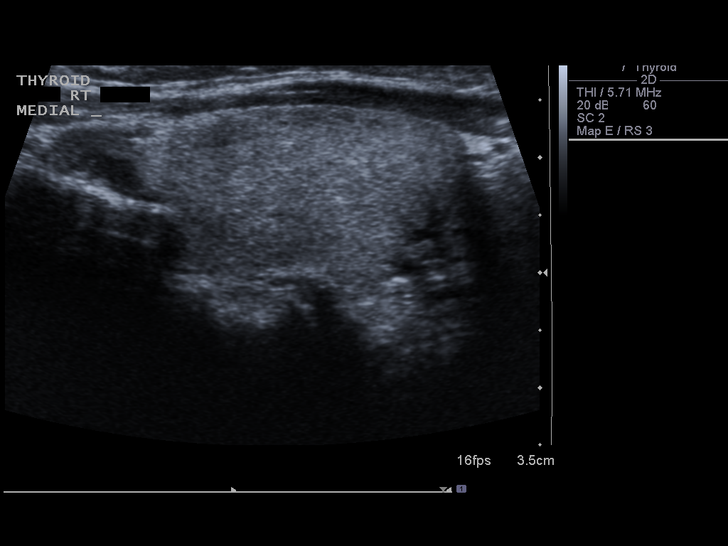
[im 11/30]
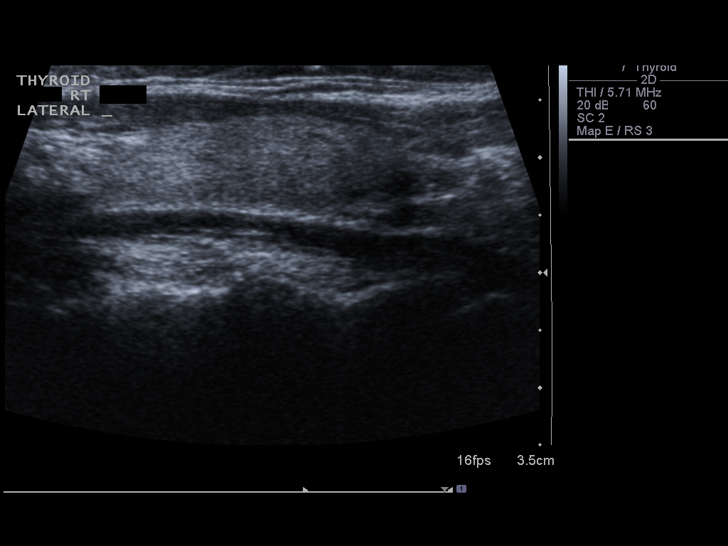
[im 14/30]
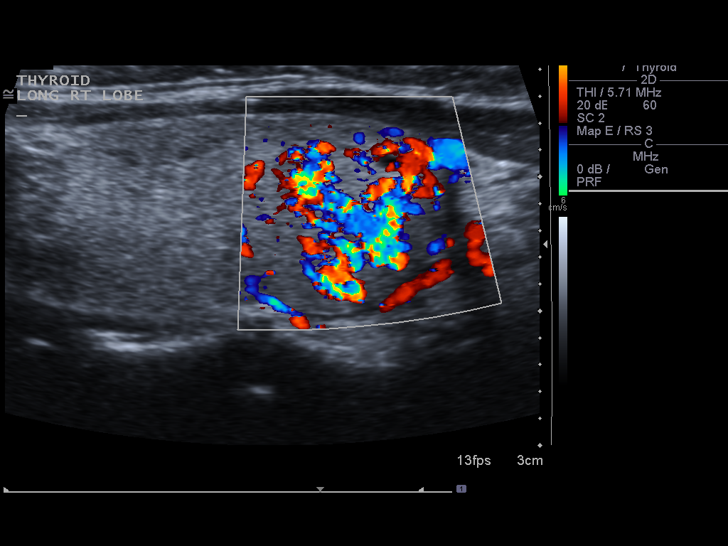
[im 16/30]
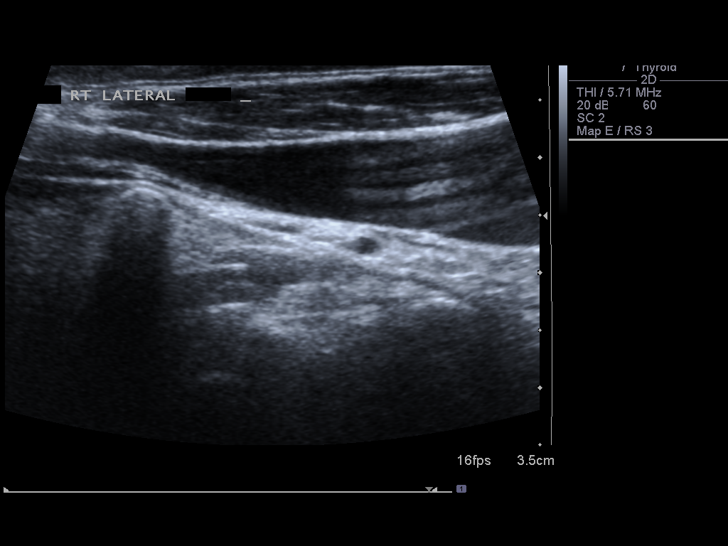
[im 19/30]
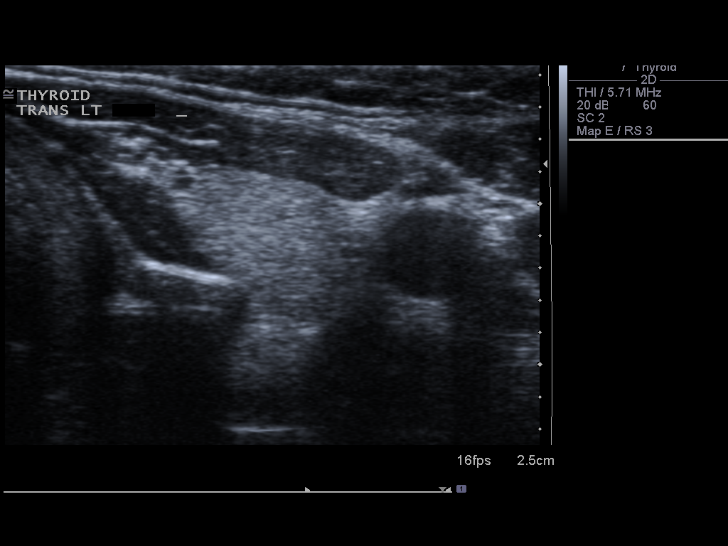
[im 20/30]
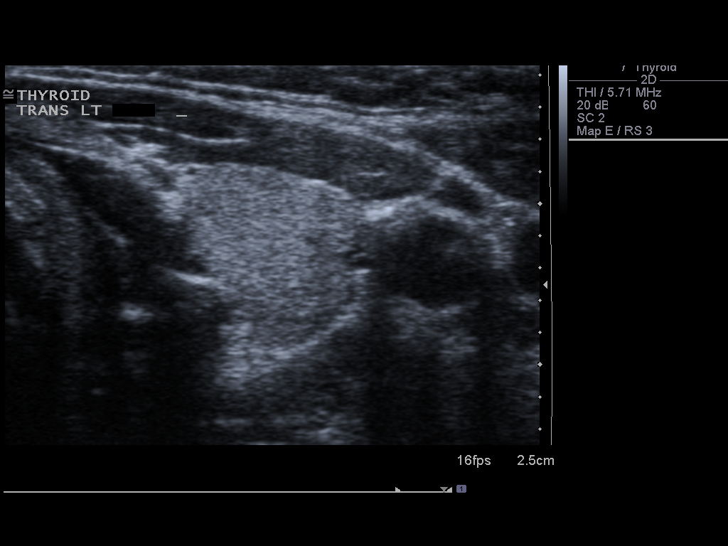
[im 22/30]
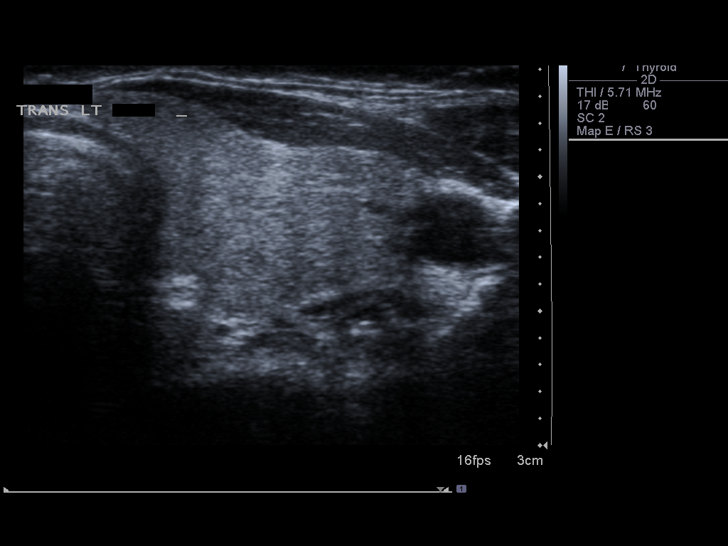
[im 25/30]
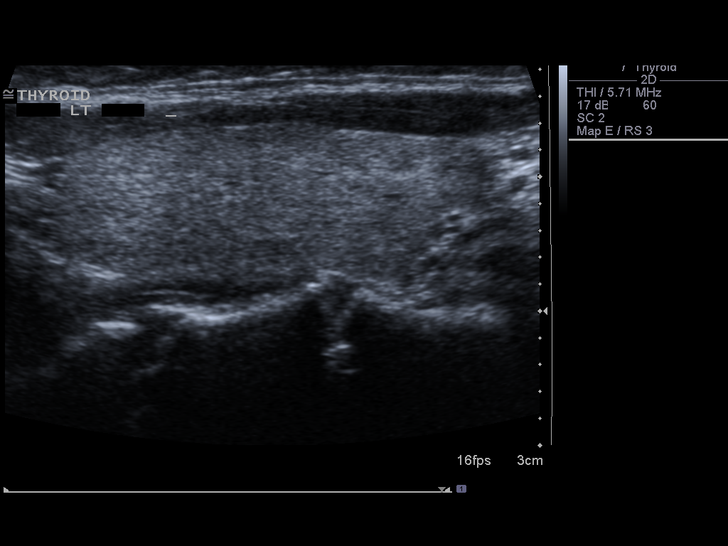
[im 27/30]
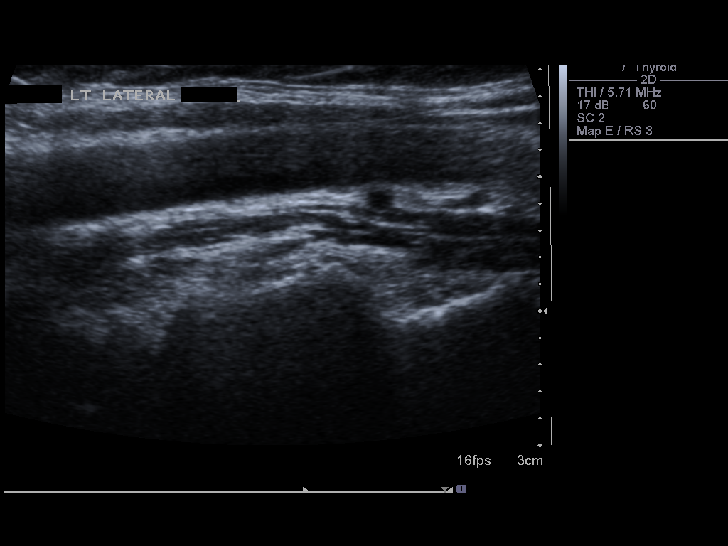
[im 30/30]
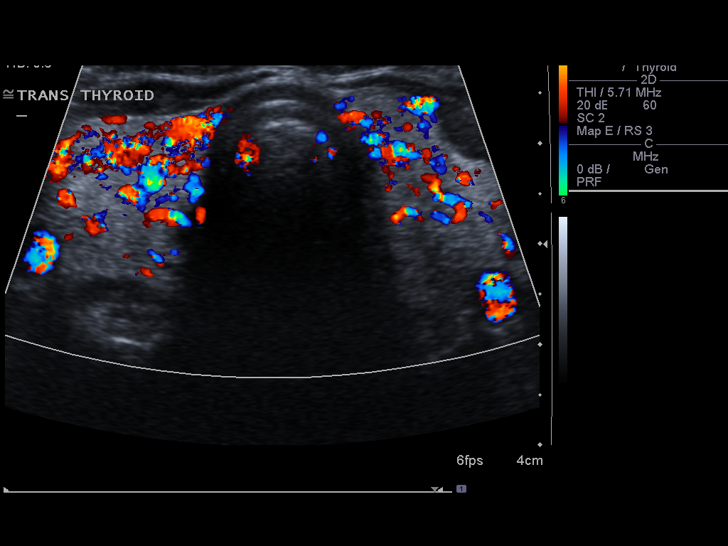

[14 of 25 positions shown; findings below may reference images not displayed]

FINDINGS: Right thyroid lobe:  3.9 x 1.4 x 2.1 cm.
Left thyroid lobe:  3.3 x 1.2 x 1.9 cm.
Isthmus:  0.7 mm

Focal nodules:  Only a small cyst of 3 mm in diameter is present in
the lower pole.

Lymphadenopathy:  None visualized.
IMPRESSION: The thyroid gland is normal in size with a single 3 mm cyst in the
lower pole on the right.

## 2012-09-24 LAB — HM MAMMOGRAPHY: HM Mammogram: NEGATIVE

## 2012-12-13 ENCOUNTER — Ambulatory Visit: Payer: 59 | Admitting: Obstetrics and Gynecology

## 2012-12-13 ENCOUNTER — Encounter: Payer: Self-pay | Admitting: Obstetrics and Gynecology

## 2012-12-13 VITALS — BP 106/64 | Ht 61.0 in | Wt 138.0 lb

## 2012-12-13 DIAGNOSIS — E559 Vitamin D deficiency, unspecified: Secondary | ICD-10-CM

## 2012-12-13 DIAGNOSIS — R635 Abnormal weight gain: Secondary | ICD-10-CM

## 2012-12-13 DIAGNOSIS — N951 Menopausal and female climacteric states: Secondary | ICD-10-CM

## 2012-12-13 DIAGNOSIS — R232 Flushing: Secondary | ICD-10-CM | POA: Insufficient documentation

## 2012-12-13 DIAGNOSIS — D219 Benign neoplasm of connective and other soft tissue, unspecified: Secondary | ICD-10-CM | POA: Insufficient documentation

## 2012-12-13 LAB — TSH: TSH: 4.097 u[IU]/mL (ref 0.350–4.500)

## 2012-12-13 NOTE — Patient Instructions (Signed)
To develop good sleep habits:    Go to bed and get up  at the same time each day (even on your days off)  Avoid caffeine, alcohol or nicotine at least 4-6 hours before bedtime  If you haven't fallen asleep within 15 minutes of getting in the bed, get up and do something non-stimulating  until you feel sleepy again,  then return to bed.  Only try to sleep when you are actually sleepy.  Do not watch TV, read, write, play games or talk on the phone in bed.  Only use the bed for sleep and sex  Do not nap or remain  in the bed if you are awake  Do not go to bed too  hungry or  too full   Develop a routine prior to bedtime so that your body will get a signal that bedtime is near  Do not do anything stimulating before bedtime  Make sure that your bedroom is comfortable for sleeping   

## 2012-12-13 NOTE — Progress Notes (Signed)
51 YO S/P Robotic Hysterectomy/BSO complaining of hot flashes, fatigue and weight gain (10 lbs since July 2013). Was given estradiol tablets initially but was afraid to take it so stopped after 1 month. Has tried [employer], [place], [employer] and other OTC remedies.  Has a past history of Vitamin D Deficiency and has been on 50,000 units of vitamin D every other week since June 2013.  She also had her thyroid tested in 2012 along with an ultrasound for thyromegaly but results were normal.  States that her hot flashes are primarily at night and prevent her from sleeping.  Reviewed sleep hygiene measures and asked her to eat a high protein snack an hour or so before bedtime to even out nighttime blood sugars.  Reviewed herbal, non-estrogen and miscellaneous management options for menopausal symptoms.  Patient want to try herbal but may consider progesterone later.  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

## 2012-12-14 LAB — VITAMIN D 25 HYDROXY (VIT D DEFICIENCY, FRACTURES): Vit D, 25-Hydroxy: 31 ng/mL (ref 30–89)

## 2012-12-14 LAB — FOLLICLE STIMULATING HORMONE: FSH: 60.2 m[IU]/mL

## 2012-12-20 ENCOUNTER — Telehealth (INDEPENDENT_AMBULATORY_CARE_PROVIDER_SITE_OTHER): Payer: Self-pay | Admitting: General Surgery

## 2012-12-20 NOTE — Telephone Encounter (Signed)
Patient called in stating that she thinks she has a "kink" in her intestines because she has been constipated the last couple days. Patient stated that he saw her ob/gyn doctor and that she recommended that she come to our office to see Dr. Corliss Skains. I checked the note in the system and that doctor does not make any mention of her having any problems with constipation or anything that required surgical intervention. Patient said that her husband saw Dr. Corliss Skains and that she did not need a referral. I advised her that her husband's care is not related to hers and that constipation can be caused my a large number of reasons. I advised the patient that we are not a pcp office and that she really needs to be evaluated by a pcp or gastroenterologist to determine if her problem is something that requires surgical intervention.

## 2013-03-03 ENCOUNTER — Ambulatory Visit (INDEPENDENT_AMBULATORY_CARE_PROVIDER_SITE_OTHER): Payer: 59 | Admitting: Obstetrics & Gynecology

## 2013-03-03 ENCOUNTER — Encounter: Payer: Self-pay | Admitting: *Deleted

## 2013-03-03 ENCOUNTER — Encounter: Payer: Self-pay | Admitting: Obstetrics & Gynecology

## 2013-03-03 VITALS — BP 108/70 | HR 76 | Ht 62.0 in | Wt 139.0 lb

## 2013-03-03 DIAGNOSIS — Z01419 Encounter for gynecological examination (general) (routine) without abnormal findings: Secondary | ICD-10-CM

## 2013-03-03 DIAGNOSIS — Z0000 Encounter for general adult medical examination without abnormal findings: Secondary | ICD-10-CM

## 2013-03-03 LAB — POCT URINALYSIS DIPSTICK
Leukocytes, UA: NEGATIVE
Urobilinogen, UA: NEGATIVE
pH, UA: 7

## 2013-03-03 LAB — HEMOGLOBIN, FINGERSTICK: Hemoglobin, fingerstick: 14.9 g/dL (ref 12.0–16.0)

## 2013-03-03 MED ORDER — TEMAZEPAM 15 MG PO CAPS: 15.0000 mg | ORAL_CAPSULE | Freq: Every evening | ORAL | Status: DC | PRN

## 2013-03-03 NOTE — Progress Notes (Deleted)
52 y.o. G1P1 Married{Race/ethnicity:17218}F here for annual exam.    No LMP recorded. Patient has had a hysterectomy.          Sexually active: {yes no:314532} yes  The current method of family planning is status post hysterectomy.    Exercising: yes  Home exercise routine includes walking 3 hrs per week. Smoker:  no  Health Maintenance: Pap:  02/11/2012 MMG:  06/2012 Colonoscopy:  never [id number]    TDaP:  2010 Labs:  2013, here, all normal   reports that she has been smoking.  She does not have any smokeless tobacco history on file. She reports that she does not drink alcohol or use illicit drugs.  Past Medical History  Diagnosis Date  . No pertinent past medical history   . PONV (postoperative nausea and vomiting)   . History of smoking   . Fibroids   . Hot flashes   . Scoliosis     H/O  . Endometriosis     H/O  . Vitamin D deficiency   . Thyromegaly 2012    normal ultrasound 2012 per patient  . S/P tonsillectomy   . Degenerative disc disease, lumbar     Past Surgical History  Procedure Laterality Date  . Cholecystectomy  2004  . Robotic hysterectomy/bso  05/2012    Current Outpatient Prescriptions  Medication Sig Dispense Refill  . doxylamine, Sleep, (SLEEP AID) 25 MG tablet Take 25 mg by mouth at bedtime as needed. For sleep      . estradiol (VIVELLE-DOT) 0.025 MG/24HR Place 1 patch onto the skin 2 (two) times a week.  8 patch  12  . Multiple Vitamins-Minerals (MULTIVITAMIN WITH MINERALS) tablet Take 1 tablet by mouth daily.      . promethazine (PHENERGAN) 12.5 MG tablet Take 2 tablets (25 mg total) by mouth every 6 (six) hours as needed for nausea (can take 1/2 tab if desired).  30 tablet  0  . Vitamin D, Ergocalciferol, (DRISDOL) 50000 UNITS CAPS Take 50,000 Units by mouth.       No current facility-administered medications for this visit.    No family history on file.  ROS:  Pertinent items are noted in HPI.  Otherwise, a comprehensive ROS was  negative.  Exam:   There were no vitals taken for this visit.  Weight change: @WEIGHTCHANGE @ Height:      Ht Readings from Last 3 Encounters:  12/13/12 5\' 1"  (1.549 m)  06/15/12 5\' 1"  (1.549 m)  06/15/12 5\' 1"  (1.549 m)    General appearance: alert, cooperative and appears stated age Head: Normocephalic, without obvious abnormality, atraumatic Neck: no adenopathy, supple, symmetrical, trachea midline and thyroid {EXAM; THYROID:18604} Lungs: clear to auscultation bilaterally Breasts: {Exam; breast:13139::"normal appearance, no masses or tenderness"} Heart: regular rate and rhythm Abdomen: soft, non-tender; bowel sounds normal; no masses,  no organomegaly Extremities: extremities normal, atraumatic, no cyanosis or edema Skin: Skin color, texture, turgor normal. No rashes or lesions Lymph nodes: Cervical, supraclavicular, and axillary nodes normal. No abnormal inguinal nodes palpated Neurologic: Grossly normal   Pelvic: External genitalia:  no lesions              Urethra:  normal appearing urethra with no masses, tenderness or lesions              Bartholins and Skenes: normal                 Vagina: normal appearing vagina with normal color and discharge, no lesions  Cervix: {exam; cervix:14595}              Pap taken: {yes no:314532} Bimanual Exam:  Uterus:  {exam; uterus:12215}              Adnexa: {exam; adnexa:12223}               Rectovaginal: Confirms               Anus:  normal sphincter tone, no lesions  A:  Well Woman with normal exam  P:   {plan; gyn:5269::"mammogram","pap smear","return annually or prn"}  An After Visit Summary was printed and given to the patient.

## 2013-03-03 NOTE — Progress Notes (Signed)
52 y.o. G1P1 MarriedCaucasianF here for annual exam.  No vaginal bleeding.  Has done well since surgery.  Has gained four pounds.  Wants to get them off.  She is walking about two miles four to five times a night.  Occasional hot flashes.  Stopped HRT.  Wants to see if I am okay with this.  Major issue is sleep. Just has trouble staying asleep.  Gets a stretch of three hours or so every night.  Tried OTC products but made her too jittery.  No LMP recorded. Patient has had a hysterectomy.          Sexually active: yes  The current method of family planning is status post hysterectomy.    Exercising: yes  Home exercise routine includes walking 0.75 hrs per day. Smoker:  yes  Health Maintenance: Pap:  3/13 neg with neg HRHPV MMG:  11/13 Colonoscopy:  never BMD:   never TDaP:  2010 with Dr. Ricki Arlando Leisinger Labs: 2012 here, all normal   reports that she has been smoking.  She does not have any smokeless tobacco history on file. She reports that she does not drink alcohol or use illicit drugs.  Past Medical History  Diagnosis Date  . No pertinent past medical history   . PONV (postoperative nausea and vomiting)   . History of smoking   . Fibroids   . Hot flashes   . Scoliosis     H/O  . Endometriosis     H/O  . Vitamin D deficiency   . Degenerative disc disease, lumbar     Past Surgical History  Procedure Laterality Date  . Laparoscopic cholecystectomy  2004  . Robotic hysterectomy/bso  05/2012  . Tonsillectomy  1970    Current Outpatient Prescriptions  Medication Sig Dispense Refill  . Multiple Vitamins-Minerals (MULTIVITAMIN WITH MINERALS) tablet Take 1 tablet by mouth daily.      . Vitamin D, Ergocalciferol, (DRISDOL) 50000 UNITS CAPS Take 50,000 Units by mouth.      . doxylamine, Sleep, (SLEEP AID) 25 MG tablet Take 25 mg by mouth at bedtime as needed. For sleep      . estradiol (VIVELLE-DOT) 0.025 MG/24HR Place 1 patch onto the skin 2 (two) times a week.  8 patch  12  . promethazine  (PHENERGAN) 12.5 MG tablet Take 2 tablets (25 mg total) by mouth every 6 (six) hours as needed for nausea (can take 1/2 tab if desired).  30 tablet  0  . temazepam (RESTORIL) 15 MG capsule Take 1 capsule (15 mg total) by mouth at bedtime as needed for sleep.  30 capsule  0   No current facility-administered medications for this visit.    History reviewed. No pertinent family history.  ROS:  Pertinent items are noted in HPI.  Otherwise, a comprehensive ROS was negative.  Exam:   BP 108/70  Pulse 76  Ht 5\' 2"  (1.575 m)  Wt 139 lb (63.05 kg)  BMI 25.42 kg/m2  Weight change: @WEIGHTCHANGE @ Height:   Height: 5\' 2"  (157.5 cm)  Ht Readings from Last 3 Encounters:  03/03/13 5\' 2"  (1.575 m)  12/13/12 5\' 1"  (1.549 m)  06/15/12 5\' 1"  (1.549 m)    General appearance: alert, cooperative and appears stated age Head: Normocephalic, without obvious abnormality, atraumatic Neck: no adenopathy, supple, symmetrical, trachea midline and thyroid normal to inspection and palpation Lungs: clear to auscultation bilaterally Breasts: normal appearance, no masses or tenderness Heart: regular rate and rhythm Abdomen: soft, non-tender; bowel sounds normal; no  masses,  no organomegaly Extremities: extremities normal, atraumatic, no cyanosis or edema Skin: Skin color, texture, turgor normal. No rashes or lesions Lymph nodes: Cervical, supraclavicular, and axillary nodes normal. No abnormal inguinal nodes palpated Neurologic: Grossly normal   Pelvic: External genitalia:  no lesions              Urethra:  normal appearing urethra with no masses, tenderness or lesions              Bartholins and Skenes: normal                 Vagina: normal appearing vagina with normal color and discharge, no lesions              Cervix: absent              Pap taken: no Bimanual Exam:  Uterus:  uterus absent              Adnexa: no mass, fullness, tenderness               Rectovaginal: Confirms               Anus:   normal sphincter tone, no lesions  A:  Well Woman with normal exam S/P Robotic assisted TLH/BSO Off HRT Insomnia Smoker  P:   Mammogram yearly Refer for screening colonoscopy pap smear not needed--h/o TLH/BSO 2013 Trial of Restoril 15mg  qhs.  Rx given for #30/0RF.  Pt TCB and give update. return annually or prn  An After Visit Summary was printed and given to the patient.

## 2013-03-03 NOTE — Patient Instructions (Signed)

## 2013-04-19 ENCOUNTER — Other Ambulatory Visit: Payer: Self-pay | Admitting: Obstetrics & Gynecology

## 2013-04-19 NOTE — Telephone Encounter (Signed)
Patient needs refill on sleep aid. Patient had it filled on  [address] Dr. Rushie Chestnut) Patient wants the refills faxed to the [facility] in [place].  Phone number :[phone]

## 2013-04-19 NOTE — Telephone Encounter (Signed)
Last Aex 03/03/13 Patient Vit D was also requested by pharmacy.  Approval for sleep aid needed.

## 2013-04-20 NOTE — Telephone Encounter (Signed)
Can you pull chart for me?

## 2013-04-22 ENCOUNTER — Other Ambulatory Visit: Payer: Self-pay | Admitting: Obstetrics & Gynecology

## 2013-04-22 MED ORDER — TEMAZEPAM 15 MG PO CAPS: 15.0000 mg | ORAL_CAPSULE | Freq: Every evening | ORAL | Status: DC | PRN

## 2013-04-22 NOTE — Telephone Encounter (Signed)
RX printed, signed by Dr.Romine and faxed by me to [facility] [phone]

## 2013-04-22 NOTE — Telephone Encounter (Addendum)
Patient called and said her sleep aid has still not been filled at [facility] in [place]

## 2013-04-22 NOTE — Telephone Encounter (Signed)
Faxed refill request received from pharmacy for TEMAZEPAM Last filled by MD on 03/03/13, #30 X 0.  Pt advised to call office with update in one month.  Pt called on 04/19/13 to request refill.   I spoke to Mrs. Tuch and she states that 15mg  is working well.  She is able to go to sleep and stay asleep at night.  She is not drowsy in the morning.  Pt would like refill. Last AEX - 03/03/13  Next AEX - 06/02/14 Please advise refills. [employer] fax [phone]

## 2013-04-22 NOTE — Telephone Encounter (Signed)
Fine to refill x 2

## 2013-04-22 NOTE — Telephone Encounter (Signed)
Shirley Conley was going to call and see if Shirley Conley was taking this every week or every other week.  She saw another gyn earlier this year and I am unsure of how frequently she is taking it.  Thanks.

## 2013-04-25 ENCOUNTER — Telehealth: Payer: Self-pay

## 2013-04-25 NOTE — Telephone Encounter (Signed)
6/9 lmtcb//kn 

## 2013-05-02 NOTE — Telephone Encounter (Signed)
6/16 spoke with patient states she is taking her Vitamin D every other week. States this was called in for her by Dr Tresa Res along with refills on her temazepam. She did have her Vitamin D level checked at [facility] when she went in for a consult on bioidenticals. States she is having night sweats that wake her 3-4 times nightly. She is c/o taking any rx HRT, but is willing to try something now. Has tried vivelle dot 0.025mg  post surgery, but did not seem to get relief. Then she tried estradiol, it caused joint pain. Aware will discuss with Dr Hyacinth Meeker to see if any other options, she is c/o side effects and the risk for breast cancer if taking the HRT. Please advise. I also put her chart on your desk, if needed.

## 2013-05-02 NOTE — Telephone Encounter (Signed)
Spoke with patient will call her back with all the information.

## 2013-05-02 NOTE — Telephone Encounter (Signed)
6/16 lmtcb//kn 

## 2013-05-03 ENCOUNTER — Telehealth: Payer: Self-pay | Admitting: Obstetrics & Gynecology

## 2013-05-03 MED ORDER — ESTRADIOL 0.05 MG/24HR TD PTTW: 1.0000 | MEDICATED_PATCH | TRANSDERMAL | Status: DC

## 2013-05-03 NOTE — Telephone Encounter (Signed)
She can start minivelle at 0.05 mg dose  She may need a consult to discuss her concerns regarding risks. Tresa Endo did call her yesterday  There is a phone note open.

## 2013-05-03 NOTE — Telephone Encounter (Signed)
LEFT MESSAGE ON cb# and voice mail. Unable to see any documentation from [facility], CMA, . See note below of patient statement .

## 2013-05-03 NOTE — Telephone Encounter (Signed)
Worsening hot flashes per patient. Patient states she is returning [facility] call from yesterday  but no call logged. Routing call to triage.

## 2013-05-03 NOTE — Telephone Encounter (Signed)
Patient notified on home # of Dr. Hyacinth Meeker medication of minivelle. . Medication sent e-script to [facility] pharmacy , [place]. Patient scheduled appt. July 9th with Dr. Hyacinth Meeker for follow up with this medicine.

## 2013-05-06 ENCOUNTER — Telehealth: Payer: Self-pay | Admitting: Orthopedic Surgery

## 2013-05-06 NOTE — Telephone Encounter (Signed)
Spoke with pharmacist to clarify vivelle dot patch should be twice weekly.

## 2013-05-06 NOTE — Telephone Encounter (Signed)
This dosage is always twice weekly.  Please clarify with pharmacy.

## 2013-05-06 NOTE — Telephone Encounter (Signed)
Spoke with pharmacist who wanted to clarify pt's vivelle dot Rx. Is it supposed to be 0.05 mg patch to be placed once a week, or twice a week? They have once weekly on file from 05-03-13, but said that is not the typical dose. Please advise.

## 2013-05-25 ENCOUNTER — Encounter: Payer: Self-pay | Admitting: Obstetrics & Gynecology

## 2013-05-25 ENCOUNTER — Ambulatory Visit: Payer: 59 | Admitting: Obstetrics & Gynecology

## 2013-05-26 ENCOUNTER — Ambulatory Visit (INDEPENDENT_AMBULATORY_CARE_PROVIDER_SITE_OTHER): Payer: 59 | Admitting: Obstetrics & Gynecology

## 2013-05-26 ENCOUNTER — Encounter: Payer: Self-pay | Admitting: Obstetrics & Gynecology

## 2013-05-26 VITALS — BP 108/66 | HR 64 | Ht 62.0 in | Wt 139.0 lb

## 2013-05-26 DIAGNOSIS — N951 Menopausal and female climacteric states: Secondary | ICD-10-CM

## 2013-05-26 MED ORDER — ESTRADIOL 0.05 MG/24HR TD PTTW: 1.0000 | MEDICATED_PATCH | TRANSDERMAL | Status: DC

## 2013-05-26 MED ORDER — VITAMIN D (ERGOCALCIFEROL) 1.25 MG (50000 UNIT) PO CAPS: 50000.0000 [IU] | ORAL_CAPSULE | ORAL | Status: DC

## 2013-05-26 MED ORDER — TEMAZEPAM 15 MG PO CAPS: 15.0000 mg | ORAL_CAPSULE | Freq: Every evening | ORAL | Status: DC | PRN

## 2013-05-26 NOTE — Progress Notes (Signed)
Subjective:     Patient ID: Shirley Conley, female   DOB: 1961-01-27, 52 y.o.   MRN: [id number]  HPI Here for follow-up after starting HRT and Restoril.  She is feeling SO much better.  She is not using the restoril nightly.  She is taking the Vivelle dot 0.05 mg patches without difficulty.  Also has questions about her Vit D.  Level was 31 in January.  She is taking this every other week.  Needs refills.      Review of Systems  All other systems reviewed and are negative.       Objective:   Physical Exam  Constitutional: She appears well-developed and well-nourished.  HENT:  Head: Normocephalic and atraumatic.       Assessment:     Menopausal symptoms, improved with Vivelle dot Insomnia     Plan:     Continue Vivelle dot 0.05mg  patches twice weekly Restoril rx to pt Vit D 50,000 IU every other week.  Will check next year at AEX.

## 2013-06-02 ENCOUNTER — Telehealth: Payer: Self-pay

## 2013-06-02 NOTE — Telephone Encounter (Signed)
Request from pharmacy to clarify Vivelle Dot directions-per Dr Rondel Baton office note patient is to continue vivelle dot 0.05mg  patch twice weekly. Pharmacy will change from once weekly to twice weekly.

## 2013-11-24 ENCOUNTER — Other Ambulatory Visit: Payer: Self-pay | Admitting: Obstetrics & Gynecology

## 2013-11-25 NOTE — Telephone Encounter (Signed)
Last Refilled: 05/26/13 # 30/3 refills Last AEX: 03/03/13 Current AEX scheduled for 06/02/14  Please advise.

## 2014-03-13 ENCOUNTER — Ambulatory Visit (INDEPENDENT_AMBULATORY_CARE_PROVIDER_SITE_OTHER): Payer: 59 | Admitting: Obstetrics & Gynecology

## 2014-03-13 ENCOUNTER — Encounter: Payer: Self-pay | Admitting: Obstetrics & Gynecology

## 2014-03-13 ENCOUNTER — Telehealth: Payer: Self-pay | Admitting: Obstetrics & Gynecology

## 2014-03-13 VITALS — BP 104/68 | HR 60 | Resp 16 | Ht 61.0 in | Wt 143.0 lb

## 2014-03-13 DIAGNOSIS — Z01419 Encounter for gynecological examination (general) (routine) without abnormal findings: Secondary | ICD-10-CM

## 2014-03-13 DIAGNOSIS — Z1211 Encounter for screening for malignant neoplasm of colon: Secondary | ICD-10-CM

## 2014-03-13 DIAGNOSIS — Z0000 Encounter for general adult medical examination without abnormal findings: Secondary | ICD-10-CM

## 2014-03-13 LAB — HEMOGLOBIN, FINGERSTICK: HEMOGLOBIN, FINGERSTICK: 15.8 g/dL (ref 12.0–16.0)

## 2014-03-13 LAB — POCT URINALYSIS DIPSTICK
Bilirubin, UA: NEGATIVE
Blood, UA: NEGATIVE
Glucose, UA: NEGATIVE
Ketones, UA: NEGATIVE
LEUKOCYTES UA: NEGATIVE
NITRITE UA: NEGATIVE
PH UA: 7
PROTEIN UA: NEGATIVE
UROBILINOGEN UA: NEGATIVE

## 2014-03-13 MED ORDER — TEMAZEPAM 15 MG PO CAPS: ORAL_CAPSULE | ORAL | Status: DC

## 2014-03-13 MED ORDER — ESTRADIOL 0.05 MG/24HR TD PTTW: 1.0000 | MEDICATED_PATCH | TRANSDERMAL | Status: DC

## 2014-03-13 NOTE — Telephone Encounter (Signed)
Called [facility] to clarify, order to place patch 2 times per week per Dr. Sanjuan Dame notes.   Routing to provider for final review. Patient agreeable to disposition. Will close encounter

## 2014-03-13 NOTE — Progress Notes (Signed)
53 y.o. G1P1 MarriedCaucasianF here for annual exam.  Doing well.  No vaginal bleeding.  Reports since hysterectomy, her low back pain is so much better.  Back really only bothers pt when she's done a lot of yard work.  Uses restoril only if she hasn't had a good night's sleep for 3-4 days.  Does need RF.  Doing well on HRT.  Does have occasional night sweats.  No hot flashes during the day.    Husband's mother has cervical cancer.  Took almost a year to find it.  She is now under hospice care.  She is 53 yo.  Very stressful for pt and her husband's extended family.     Patient's last menstrual period was 05/17/2012.          Sexually active: yes  The current method of family planning is status post hysterectomy.    Exercising: no  not regularly Smoker:  Yes one pack per week  Health Maintenance: Pap:  02/11/12 WNL/negative HR HPV History of abnormal Pap:  yes MMG:  2014-next one scheduled for 5/15 Colonoscopy:  none BMD:   none TDaP:  2010 with Dr Minna Antis Screening Labs: today, Hb today: 15.8, Urine today: PH-7.0   reports that she has been smoking.  She has never used smokeless tobacco. She reports that she does not drink alcohol or use illicit drugs.  Past Medical History  Diagnosis Date  . No pertinent past medical history   . PONV (postoperative nausea and vomiting)   . History of smoking   . Fibroids   . Hot flashes   . Scoliosis     H/O  . Endometriosis     H/O  . Vitamin D deficiency   . Degenerative disc disease, lumbar     Past Surgical History  Procedure Laterality Date  . Laparoscopic cholecystectomy  2004  . Robotic hysterectomy/bso  05/2012  . Tonsillectomy  1970    Current Outpatient Prescriptions  Medication Sig Dispense Refill  . estradiol (VIVELLE-DOT) 0.05 MG/24HR Place 1 patch (0.05 mg total) onto the skin once a week.  8 patch  12  . Multiple Vitamins-Minerals (MULTIVITAMIN WITH MINERALS) tablet Take 1 tablet by mouth daily.      . temazepam (RESTORIL)  15 MG capsule TAKE 1 CAPSULE BY MOUTH EVERY NIGHT AT BEDTIME AS NEEDED FOR SLEEP  30 capsule  0  . Vitamin D, Ergocalciferol, (DRISDOL) 50000 UNITS CAPS capsule Take 50,000 Units by mouth every 14 (fourteen) days. This is OTC now       No current facility-administered medications for this visit.    Family History  Problem Relation Age of Onset  . Osteoporosis Mother   . Cancer Maternal Aunt     cervical  . Diabetes Maternal Grandfather   . Diabetes Mother     ROS:  Pertinent items are noted in HPI.  Otherwise, a comprehensive ROS was negative.  Exam:   BP 104/68  Pulse 60  Resp 16  Ht 5\' 1"  (1.549 m)  Wt 143 lb (64.864 kg)  BMI 27.03 kg/m2  LMP 05/17/2012  Weight change: +9lbs   Height: 5\' 1"  (154.9 cm)  Ht Readings from Last 3 Encounters:  03/13/14 5\' 1"  (1.549 m)  05/26/13 5\' 2"  (1.575 m)  03/03/13 5\' 2"  (1.575 m)    General appearance: alert, cooperative and appears stated age Head: Normocephalic, without obvious abnormality, atraumatic Neck: no adenopathy, supple, symmetrical, trachea midline and thyroid normal to inspection and palpation Lungs: clear to auscultation  bilaterally Breasts: normal appearance, no masses or tenderness Heart: regular rate and rhythm Abdomen: soft, non-tender; bowel sounds normal; no masses,  no organomegaly Extremities: extremities normal, atraumatic, no cyanosis or edema Skin: Skin color, texture, turgor normal. No rashes or lesions Lymph nodes: Cervical, supraclavicular, and axillary nodes normal. No abnormal inguinal nodes palpated Neurologic: Grossly normal   Pelvic: External genitalia:  no lesions              Urethra:  normal appearing urethra with no masses, tenderness or lesions              Bartholins and Skenes: normal                 Vagina: normal appearing vagina with normal color and discharge, no lesions              Cervix: absent              Pap taken: no Bimanual Exam:  Uterus:  uterus absent              Adnexa:  no mass, fullness, tenderness               Rectovaginal: Confirms               Anus:  normal sphincter tone, no lesions  A:  Well Woman with normal exam S/P TLH/BSO 7/13.  On HRT. DDD with scoliosis in her back Insomnia  P:   Mammogram scheduled Colonoscopy referral made.  Screening is due. pap smear not indicated. Vivelle dot 0.05mg  one patch to skin twice weekly.  May want to do mail order.  Will call if desires this to change. Resotril 15mg  nightly prn.  #30/3RF return annually or prn  An After Visit Summary was printed and given to the patient.

## 2014-03-13 NOTE — Telephone Encounter (Signed)
Pharmacy is calling to verify rx. She says the patch is normally put on twice a week and the directions say once a week so she just wanted to make sure that the directions is correct.  estradiol (VIVELLE-DOT) 0.05 MG/24HR patch  Place 1 patch (0.05 mg total) onto the skin once a week., Starting 03/13/2014, Until Discontinued, Normal, Last Dose: Not Recorded  Refills: 12 ordered Pharmacy: [id number] - [place], [place]. [address]

## 2014-03-14 LAB — LIPID PANEL
Cholesterol: 234 mg/dL — ABNORMAL HIGH (ref 0–200)
HDL: 75 mg/dL (ref >39)
LDL Cholesterol: 137 mg/dL — ABNORMAL HIGH (ref 0–99)
Total CHOL/HDL Ratio: 3.1 Ratio
Triglycerides: 108 mg/dL (ref <150)
VLDL: 22 mg/dL (ref 0–40)

## 2014-03-14 LAB — COMPREHENSIVE METABOLIC PANEL
ALBUMIN: 4.6 g/dL (ref 3.5–5.2)
ALT: 21 U/L (ref 0–35)
AST: 20 U/L (ref 0–37)
Alkaline Phosphatase: 83 U/L (ref 39–117)
BUN: 9 mg/dL (ref 6–23)
CO2: 28 mEq/L (ref 19–32)
Calcium: 9.9 mg/dL (ref 8.4–10.5)
Chloride: 104 mEq/L (ref 96–112)
Creat: 0.82 mg/dL (ref 0.50–1.10)
Glucose, Bld: 91 mg/dL (ref 70–99)
POTASSIUM: 4 meq/L (ref 3.5–5.3)
Sodium: 144 mEq/L (ref 135–145)
Total Bilirubin: 0.4 mg/dL (ref 0.2–1.2)
Total Protein: 7.4 g/dL (ref 6.0–8.3)

## 2014-03-14 LAB — TSH: TSH: 3.616 u[IU]/mL (ref 0.350–4.500)

## 2014-03-14 LAB — VITAMIN D 25 HYDROXY (VIT D DEFICIENCY, FRACTURES): VIT D 25 HYDROXY: 36 ng/mL (ref 30–89)

## 2014-04-14 ENCOUNTER — Telehealth: Payer: Self-pay | Admitting: Obstetrics & Gynecology

## 2014-04-14 NOTE — Telephone Encounter (Signed)
Voicemail confirms mobile # on file.  Left message for patient. advised that she is scheduled to see Dr Collene Mares 06.11.2015 @ 1515. Also left Dr Youlanda Mighty office # in case the appt does not work for her.

## 2014-06-02 ENCOUNTER — Ambulatory Visit: Payer: 59 | Admitting: Obstetrics & Gynecology

## 2014-09-18 ENCOUNTER — Encounter: Payer: Self-pay | Admitting: Obstetrics & Gynecology

## 2015-03-09 ENCOUNTER — Other Ambulatory Visit: Payer: Self-pay

## 2015-03-09 DIAGNOSIS — Z1231 Encounter for screening mammogram for malignant neoplasm of breast: Secondary | ICD-10-CM

## 2015-03-13 ENCOUNTER — Ambulatory Visit: Payer: Self-pay

## 2015-03-19 ENCOUNTER — Encounter: Payer: Self-pay | Admitting: Obstetrics & Gynecology

## 2015-03-19 ENCOUNTER — Ambulatory Visit (INDEPENDENT_AMBULATORY_CARE_PROVIDER_SITE_OTHER): Payer: No Typology Code available for payment source | Admitting: Obstetrics & Gynecology

## 2015-03-19 VITALS — BP 118/64 | HR 64 | Resp 16 | Ht 61.0 in | Wt 149.0 lb

## 2015-03-19 DIAGNOSIS — Z1211 Encounter for screening for malignant neoplasm of colon: Secondary | ICD-10-CM

## 2015-03-19 DIAGNOSIS — Z0000 Encounter for general adult medical examination without abnormal findings: Secondary | ICD-10-CM | POA: Diagnosis not present

## 2015-03-19 DIAGNOSIS — Z01419 Encounter for gynecological examination (general) (routine) without abnormal findings: Secondary | ICD-10-CM | POA: Diagnosis not present

## 2015-03-19 MED ORDER — VITAMIN D (ERGOCALCIFEROL) 1.25 MG (50000 UNIT) PO CAPS: 50000.0000 [IU] | ORAL_CAPSULE | ORAL | Status: DC

## 2015-03-19 MED ORDER — TEMAZEPAM 15 MG PO CAPS: ORAL_CAPSULE | ORAL | Status: DC

## 2015-03-19 MED ORDER — ESTRADIOL 0.5 MG PO TABS: 0.5000 mg | ORAL_TABLET | Freq: Every day | ORAL | Status: DC

## 2015-03-19 NOTE — Progress Notes (Signed)
54 y.o. G1P1 MarriedCaucasianF here for annual exam.  Doing well.  No vaginal bleeding.  Had blood work done with me last year.   Pt's biggest issue is with sleep.  Just feels like she tosses and turns.  Uses the Restoril only occasionally.  She is worried about it being "habit forming".    PCP:  Dr. Benna Dunks, [facility].  Has not been seen this last year.  Patient's last menstrual period was 05/17/2012.          Sexually active: Yes.    The current method of family planning is status post hysterectomy.    Exercising: No.  not regularly Smoker:  Yes trying to quit  Health Maintenance: Pap:  02/11/12 WNL/negative HR HPV History of abnormal Pap:  yes MMG:  03/14/15-[facility] Colonoscopy:  none BMD:   none TDaP:  2010 with Dr Minna Antis Screening Labs: 2015 (here), Hb today: 14.2, Urine today: negative    reports that she has been smoking.  She has never used smokeless tobacco. She reports that she does not drink alcohol or use illicit drugs.  Past Medical History  Diagnosis Date  . History of smoking   . Hot flashes   . History of scoliosis        . History of endometriosis     and fibroids  . Vitamin D deficiency   . Degenerative disc disease, lumbar     Past Surgical History  Procedure Laterality Date  . Laparoscopic cholecystectomy  2004  . Robotic hysterectomy/bso  05/2012  . Tonsillectomy  1970    Current Outpatient Prescriptions  Medication Sig Dispense Refill  . Multiple Vitamins-Minerals (MULTIVITAMIN WITH MINERALS) tablet Take 1 tablet by mouth daily.    . temazepam (RESTORIL) 15 MG capsule TAKE 1 CAPSULE BY MOUTH EVERY NIGHT AT BEDTIME AS NEEDED FOR SLEEP 30 capsule 2  . Vitamin D, Ergocalciferol, (DRISDOL) 50000 UNITS CAPS capsule Take 50,000 Units by mouth every 14 (fourteen) days. This is OTC now    . estradiol (VIVELLE-DOT) 0.05 MG/24HR patch Place 1 patch (0.05 mg total) onto the skin once a week. (Patient not taking: Reported on 03/19/2015) 8 patch 12    No current facility-administered medications for this visit.    Family History  Problem Relation Age of Onset  . Osteoporosis Mother   . Cancer Maternal Aunt     cervical  . Diabetes Maternal Grandfather   . Diabetes Mother     ROS:  Pertinent items are noted in HPI.  Otherwise, a comprehensive ROS was negative.  Exam:   General appearance: alert, cooperative and appears stated age Head: Normocephalic, without obvious abnormality, atraumatic Neck: no adenopathy, supple, symmetrical, trachea midline and thyroid normal to inspection and palpation Lungs: clear to auscultation bilaterally Breasts: normal appearance, no masses or tenderness Heart: regular rate and rhythm Abdomen: soft, non-tender; bowel sounds normal; no masses,  no organomegaly Extremities: extremities normal, atraumatic, no cyanosis or edema Skin: Skin color, texture, turgor normal. No rashes or lesions Lymph nodes: Cervical, supraclavicular, and axillary nodes normal. No abnormal inguinal nodes palpated Neurologic: Grossly normal   Pelvic: External genitalia:  no lesions              Urethra:  normal appearing urethra with no masses, tenderness or lesions              Bartholins and Skenes: normal                 Vagina: normal appearing vagina  with normal color and discharge, no lesions              Cervix: absent              Pap taken: No. Bimanual Exam:  Uterus:  uterus absent              Adnexa: no mass, fullness, tenderness               Rectovaginal: Confirms               Anus:  normal sphincter tone, no lesions  Chaperone was present for exam.  A:  Well Woman with normal exam S/P TLH/BSO 7/13. On HRT but needs to switch from Vivelle dot patch due to cost. DDD with scoliosis in her back Insomnia  P: Mammogram done last week. Colonoscopy referral made.  Pt aware due and I recommend. pap smear not indicated Change to estradiol 0.5mg  daily.  #90/4RF Vit D 50K every other week.   #6/4RF Resotril 15mg  nightly prn. #30/3RF return annually or prn

## 2015-03-20 LAB — HEMOGLOBIN, FINGERSTICK: HEMOGLOBIN, FINGERSTICK: 14.2 g/dL (ref 12.0–16.0)

## 2016-06-05 ENCOUNTER — Ambulatory Visit: Payer: No Typology Code available for payment source | Admitting: Obstetrics & Gynecology

## 2016-12-29 ENCOUNTER — Encounter: Payer: Self-pay | Admitting: Obstetrics & Gynecology

## 2016-12-29 ENCOUNTER — Ambulatory Visit (INDEPENDENT_AMBULATORY_CARE_PROVIDER_SITE_OTHER): Payer: BLUE CROSS/BLUE SHIELD | Admitting: Obstetrics & Gynecology

## 2016-12-29 VITALS — BP 110/64 | HR 80 | Resp 16 | Ht 61.0 in | Wt 150.0 lb

## 2016-12-29 DIAGNOSIS — Z205 Contact with and (suspected) exposure to viral hepatitis: Secondary | ICD-10-CM | POA: Diagnosis not present

## 2016-12-29 DIAGNOSIS — Z0000 Encounter for general adult medical examination without abnormal findings: Secondary | ICD-10-CM

## 2016-12-29 DIAGNOSIS — Z01419 Encounter for gynecological examination (general) (routine) without abnormal findings: Secondary | ICD-10-CM

## 2016-12-29 MED ORDER — GABAPENTIN 100 MG PO CAPS: ORAL_CAPSULE | ORAL | 0 refills | Status: DC

## 2016-12-29 NOTE — Patient Instructions (Signed)
With next blood work, have a hepatitis C test done

## 2016-12-29 NOTE — Progress Notes (Signed)
56 y.o. G1P1 MarriedCaucasianF here for annual exam.  Doing well.  Denies vaginal bleeding.  PCP:  Everardo Beals, [facility]  Patient's last menstrual period was 05/17/2012.          Sexually active: Yes.    The current method of family planning is status post hysterectomy.    Exercising: No.  The patient does not participate in regular exercise at present. Smoker: former   Health Maintenance: Pap:  02/11/12 negative, HR HPV negative  History of abnormal Pap:  yes MMG:  03/13/15 B[facility] 1 negative.  Scheduled for 2/19.   Colonoscopy:  Declines BMD:  Declines TDaP:  03/03/09  Pneumonia vaccine(s):  No Zostavax:   No Hep C testing: not done, will plan with next lab work Screening Labs: 2015   reports that she quit smoking about 8 months ago. She quit after 20.00 years of use. She has never used smokeless tobacco. She reports that she does not drink alcohol or use drugs.  Past Medical History:  Diagnosis Date  . Degenerative disc disease, lumbar   . History of endometriosis    and fibroids  . History of scoliosis       . History of smoking   . Hot flashes   . Vitamin D deficiency     Past Surgical History:  Procedure Laterality Date  . LAPAROSCOPIC CHOLECYSTECTOMY  2004  . Robotic Hysterectomy/BSO  05/2012  . TONSILLECTOMY  1970    Current Outpatient Prescriptions  Medication Sig Dispense Refill  . calcium carbonate (OS-CAL - DOSED IN MG OF ELEMENTAL CALCIUM) 1250 (500 Ca) MG tablet Take 1 tablet by mouth daily with breakfast.    . cholecalciferol (VITAMIN D) 1000 units tablet Take 1,000 Units by mouth daily.    . Multiple Vitamins-Minerals (MULTIVITAMIN WITH MINERALS) tablet Take 1 tablet by mouth daily.    . temazepam (RESTORIL) 15 MG capsule TAKE 1 CAPSULE BY MOUTH EVERY NIGHT AT BEDTIME AS NEEDED FOR SLEEP 30 capsule 2   No current facility-administered medications for this visit.     Family History  Problem Relation Age of Onset  . Osteoporosis  Mother   . Diabetes Mother   . Cancer Maternal Aunt     cervical  . Diabetes Maternal Grandfather     ROS:  Pertinent items are noted in HPI.  Otherwise, a comprehensive ROS was negative.  Exam:   BP 110/64 (BP Location: Right Arm, Patient Position: Sitting, Cuff Size: Normal)   Pulse 80   Resp 16   Ht 5\' 1"  (1.549 m)   Wt 150 lb (68 kg)   LMP 05/17/2012   BMI 28.34 kg/m   Weight change:  +1#  Height: 5\' 1"  (154.9 cm)  Ht Readings from Last 3 Encounters:  12/29/16 5\' 1"  (1.549 m)  03/19/15 5\' 1"  (1.549 m)  03/13/14 5\' 1"  (1.549 m)    General appearance: alert, cooperative and appears stated age Head: Normocephalic, without obvious abnormality, atraumatic Neck: no adenopathy, supple, symmetrical, trachea midline and thyroid normal to inspection and palpation Lungs: clear to auscultation bilaterally Breasts: normal appearance, no masses or tenderness Heart: regular rate and rhythm Abdomen: soft, non-tender; bowel sounds normal; no masses,  no organomegaly Extremities: extremities normal, atraumatic, no cyanosis or edema Skin: Skin color, texture, turgor normal. No rashes or lesions Lymph nodes: Cervical, supraclavicular, and axillary nodes normal. No abnormal inguinal nodes palpated Neurologic: Grossly normal   Pelvic: External genitalia:  no lesions  Urethra:  normal appearing urethra with no masses, tenderness or lesions              Bartholins and Skenes: normal                 Vagina: normal appearing vagina with normal color and discharge, no lesions              Cervix: absent              Pap taken: No. Bimanual Exam:  Uterus:  uterus absent              Adnexa: no adnexal masses               Rectovaginal: Confirms               Anus:  normal sphincter tone, no lesions  Chaperone was present for exam.  A:  Well Woman with normal exam S/p TLH/BSO 7/13 Off HRT DDD with scoliosis in back Hot flashes, insomnia  P:   Mammogram guidelines reviewed.   Pt has appt scheduled. pap smear not indicated Declines colonoscopy.  Will see about doing Cologuard with pt. Pt will return for fasting labs.  Orders placed. Trial of neurontin 100mg  nightly for 7 nights, then 200mg  for 7 nights, then 300mg  nightly.  She will call and give update. return annually or prn

## 2017-01-06 ENCOUNTER — Telehealth: Payer: Self-pay

## 2017-01-06 NOTE — Telephone Encounter (Signed)
Left message to call [place] at [phone].  Need to discuss Cologuard with patient. Cologuard forms have been completed to submit. Patient will need to contact her insurance to verify coverage and cost before proceeding.   Cologuard CPT [id number] [employer] NPI [id number]  [employer] tax ID [phone]

## 2017-01-07 NOTE — Telephone Encounter (Signed)
Patient wants to speak with Dr. Sabra Heck about her mammogram results.

## 2017-01-07 NOTE — Telephone Encounter (Signed)
Spoke with patient. Patient has concerns about recent mammogram results. States she was told to contact our office by [facility]. Reviewed mammogram report which has been reviewed by Dr.Miller. Patient placed in hold. Advised patient that the impression from her screening mammogram shows a new oval mass in the left breast. Advised this is an indeterminate mass and further evaluation is needed with diagnostic mammogram and ultrasound. Advised at this time no mention in the report of suspicion for malignancy, but that further imaging is needed to determine what new mass is. Advised this could be many things that are not cancerous, but it is important to find out what this is and to rule out any possibility for malignancy as this is a new mass. Patient is very concerned and has many questions. Offered appointment today with another MD, but patient declines. Appointment scheduled for tomorrow 01/08/2017 at 11:15 am with Dr.Miller. Patient is agreeable to date and time. Mammogram report to [facility] desk for patient's appointment.  Routing to provider for final review. Patient agreeable to disposition. Will close encounter.

## 2017-01-07 NOTE — Telephone Encounter (Signed)
Spoke with patient. Advised of information regarding Cologuard as seen below. Patient will contact her insurance company to verify benefits and return call to the office to advise if she would like to proceed.

## 2017-01-08 ENCOUNTER — Encounter: Payer: Self-pay | Admitting: Obstetrics & Gynecology

## 2017-01-08 ENCOUNTER — Ambulatory Visit (INDEPENDENT_AMBULATORY_CARE_PROVIDER_SITE_OTHER): Payer: BLUE CROSS/BLUE SHIELD | Admitting: Obstetrics & Gynecology

## 2017-01-08 VITALS — BP 110/70 | HR 78 | Resp 16 | Ht 61.0 in | Wt 145.2 lb

## 2017-01-08 DIAGNOSIS — R232 Flushing: Secondary | ICD-10-CM | POA: Diagnosis not present

## 2017-01-08 DIAGNOSIS — R928 Other abnormal and inconclusive findings on diagnostic imaging of breast: Secondary | ICD-10-CM | POA: Diagnosis not present

## 2017-01-08 NOTE — Progress Notes (Signed)
Spoke with Sharyn Lull at [facility] to move up 3/2 appointment for diagnostic MMG and ultrasound, patient request today if availible. Was advised earliest appointment available 01/15/17, no radiologist available today. Patient declined 3/1, will keep current appointment. Patient is agreeable and verbalizes understanding.

## 2017-01-08 NOTE — Progress Notes (Signed)
GYNECOLOGY  VISIT    HPI: 56 y.o. G1P1 Married Caucasian female here to discuss mammogram results from yesterday.  She was called by Teola Bradley advising her that her mammogram was abnormal.  Report read "oval mas" which was very worrisome to the patient.  Initially, she scheduled diagnostic imaging for tomorrow but wasn't sure that she "wanted to know if this was bad" so cancelled it.  She then called here to discuss.  Pt admits she is very worried.  Denies any new breast issues--masses, nipple discharge, skin changes.    Also wants to discussed neurontin that was suggested for her hot flashes.  She wants to make sure this does not contain hormone and to review side effects.  Typical dosing and common side effects discussed.  She is willing to try this but she needed to make sure this was not a hormonal therapy.  GYNECOLOGIC HISTORY: Patient's last menstrual period was 05/17/2012. Contraception: hysterectomy Menopausal hormone therapy: none  Patient Active Problem List   Diagnosis Date Noted  . Hot flashes     Past Medical History:  Diagnosis Date  . Degenerative disc disease, lumbar   . History of endometriosis    and fibroids  . History of scoliosis       . History of smoking   . Hot flashes   . Vitamin D deficiency     Past Surgical History:  Procedure Laterality Date  . LAPAROSCOPIC CHOLECYSTECTOMY  2004  . Robotic Hysterectomy/BSO  05/2012  . TONSILLECTOMY  1970    MEDS:  Reviewed in EPIC and UTD  ALLERGIES: Codeine  Family History  Problem Relation Age of Onset  . Osteoporosis Mother   . Diabetes Mother   . Cancer Maternal Aunt     cervical  . Diabetes Maternal Grandfather    SH:  Married, non smoker  Review of Systems  Psychiatric/Behavioral: The patient is nervous/anxious.   All other systems reviewed and are negative.   PHYSICAL EXAMINATION:    BP 110/70 (BP Location: Right Arm, Patient Position: Sitting, Cuff Size: Normal)   Pulse 78   Resp 16   Ht 5'  1" (1.549 m)   Wt 145 lb 3.2 oz (65.9 kg)   LMP 05/17/2012   BMI 27.44 kg/m     Physical Exam  Constitutional: She is oriented to person, place, and time. She appears well-developed and well-nourished.  Neck: No thyromegaly present.  Respiratory: Right breast exhibits no inverted nipple, no mass, no nipple discharge, no skin change and no tenderness. Left breast exhibits no inverted nipple, no mass, no nipple discharge, no skin change and no tenderness. Breasts are symmetrical.  Lymphadenopathy:    She has no cervical adenopathy.  Neurological: She is alert and oriented to person, place, and time.  Skin: Skin is warm and dry.  Psychiatric: She has a normal mood and affect.  Anxiety noted today.   Assessment: Breast mass on mammogram Hot flashes  Plan: D/W pt the mammogram is likely seeing a cyst or benign breast finding as the lesion is smooth and oval without calcifications.  Pt reassured.  Husband with her.  Will try to move up diagnostic exam to sooner.    Pt understands dosing for Neurontin for hot flashes--100mg  nightly x 7 nights and increase by 100mg  weekly until 300mg  dosing reached.  Voices clear understanding.

## 2017-02-10 ENCOUNTER — Other Ambulatory Visit: Payer: BLUE CROSS/BLUE SHIELD

## 2017-02-12 ENCOUNTER — Encounter: Payer: Self-pay | Admitting: Obstetrics & Gynecology

## 2017-02-17 ENCOUNTER — Telehealth: Payer: Self-pay | Admitting: Obstetrics & Gynecology

## 2017-02-17 NOTE — Telephone Encounter (Signed)
Patient canceled her upcoming lab appointment 02/19/17 due to her "sick dog". Patient will call later to reschedule.

## 2017-02-19 ENCOUNTER — Other Ambulatory Visit: Payer: BLUE CROSS/BLUE SHIELD

## 2018-03-09 ENCOUNTER — Encounter: Payer: Self-pay | Admitting: Obstetrics & Gynecology

## 2018-04-08 ENCOUNTER — Ambulatory Visit: Payer: BLUE CROSS/BLUE SHIELD | Admitting: Obstetrics & Gynecology

## 2018-04-08 ENCOUNTER — Encounter

## 2021-10-17 ENCOUNTER — Ambulatory Visit: Payer: Self-pay | Admitting: Orthopaedic Surgery

## 2021-10-17 ENCOUNTER — Ambulatory Visit (INDEPENDENT_AMBULATORY_CARE_PROVIDER_SITE_OTHER): Payer: Self-pay | Admitting: Orthopedic Surgery

## 2021-10-17 ENCOUNTER — Other Ambulatory Visit: Payer: Self-pay

## 2021-10-17 ENCOUNTER — Encounter: Payer: Self-pay | Admitting: Orthopedic Surgery

## 2021-10-17 VITALS — BP 150/77 | HR 86 | Ht 61.0 in | Wt 150.0 lb

## 2021-10-17 DIAGNOSIS — M79672 Pain in left foot: Secondary | ICD-10-CM

## 2021-10-17 DIAGNOSIS — M79671 Pain in right foot: Secondary | ICD-10-CM

## 2021-10-17 NOTE — Progress Notes (Signed)
NEW PROBLEM//OFFICE VISIT   Chief Complaint  Patient presents with   Ankle Pain    Right/ fell a year ago    This is a 60 year old female who fell 1 year ago and injured her right ankle.  That subsequently got better but started hurting again subsequently stopping about 3 days ago  However she also complains of pain on the bottom of both feet she has a history of peripheral neuropathy in the family with multiple relatives  She does wear relatively good shoes but has the intermittent pain numbness and tingling feeling on the bottom of both feet       BP (!) 150/77   Pulse 86   Ht 5\' 1"  (1.549 m)   Wt 150 lb (68 kg)   LMP 05/17/2012   BMI 28.34 kg/m   Body mass index is 28.34 kg/m.  General appearance: Well-developed well-nourished no gross deformities  Cardiovascular normal pulse and perfusion normal color without edema  Neurologically no sensation loss or deficits or pathologic reflexes  Psychological: Awake alert and oriented x3 mood and affect normal  Skin no lacerations or ulcerations no nodularity no palpable masses, no erythema or nodularity  Musculoskeletal:   RIGHT FOOT: Mild pes planus in each foot Color cap refill normal No tenderness    Review of Systems  Constitutional:  Negative for fever.  Neurological:  Positive for tingling and sensory change. Negative for weakness.    Past Medical History:  Diagnosis Date   Degenerative disc disease, lumbar    History of endometriosis    and fibroids   History of scoliosis        History of smoking    Hot flashes    Vitamin D deficiency     Past Surgical History:  Procedure Laterality Date   LAPAROSCOPIC CHOLECYSTECTOMY  2004   Robotic Hysterectomy/BSO  05/2012   TONSILLECTOMY  1970    Family History  Problem Relation Age of Onset   Osteoporosis Mother    Diabetes Mother    Cancer Maternal Aunt        cervical   Diabetes Maternal Grandfather    Social History   Tobacco Use   Smoking  status: Former    Years: 20.00    Types: Cigarettes    Quit date: 04/17/2016    Years since quitting: 5.5   Smokeless tobacco: Never   Tobacco comments:    one pack/per week  Substance Use Topics   Alcohol use: No   Drug use: No    Allergies  Allergen Reactions   Codeine Nausea And Vomiting    Current Meds  Medication Sig   calcium carbonate (OS-CAL - DOSED IN MG OF ELEMENTAL CALCIUM) 1250 (500 Ca) MG tablet Take 1 tablet by mouth daily with breakfast.   cholecalciferol (VITAMIN D) 1000 units tablet Take 1,000 Units by mouth daily.   Multiple Vitamins-Minerals (MULTIVITAMIN WITH MINERALS) tablet Take 1 tablet by mouth daily.     MEDICAL DECISION MAKING  A.  Encounter Diagnosis  Name Primary?   Pain in both feet Yes    B. DATA ANALYSED:   IMAGING: Interpretation of images: I have personally reviewed the images and my interpretation is NONE NONE  Orders: NONE  Outside records reviewed: N/A   C. MANAGEMENT   POSSIBLE EARLY P. NEUROPATHY.  I DONT THINK MEDICATION INDICATED AT THIS TIME. WEAR GOOD SHOES AND REPLACE YEARLY.  IF SYMPTOMS BECOME CONSTANT THEN P. NEUROPATHY CAN BE TREATED   No orders of the  defined types were placed in this encounter.    Arther Abbott, MD  10/17/2021 9:58 AM

## 2021-11-07 ENCOUNTER — Ambulatory Visit (INDEPENDENT_AMBULATORY_CARE_PROVIDER_SITE_OTHER): Payer: Self-pay

## 2021-11-07 ENCOUNTER — Ambulatory Visit (INDEPENDENT_AMBULATORY_CARE_PROVIDER_SITE_OTHER): Payer: Self-pay | Admitting: Podiatry

## 2021-11-07 ENCOUNTER — Encounter: Payer: Self-pay | Admitting: Podiatry

## 2021-11-07 ENCOUNTER — Other Ambulatory Visit: Payer: Self-pay

## 2021-11-07 DIAGNOSIS — M722 Plantar fascial fibromatosis: Secondary | ICD-10-CM

## 2021-11-07 MED ORDER — MELOXICAM 15 MG PO TABS: 15.0000 mg | ORAL_TABLET | Freq: Every day | ORAL | 3 refills | Status: DC

## 2021-11-07 MED ORDER — TRIAMCINOLONE ACETONIDE 40 MG/ML IJ SUSP
40.0000 mg | Freq: Once | INTRAMUSCULAR | Status: AC
Start: 2021-11-07 — End: 2021-11-07
  Administered 2021-11-07: 11:00:00 40 mg

## 2021-11-07 MED ORDER — METHYLPREDNISOLONE 4 MG PO TBPK: ORAL_TABLET | ORAL | 0 refills | Status: DC

## 2021-11-07 NOTE — Progress Notes (Signed)
°  Subjective:  Patient ID: Shirley Conley, female    DOB: Dec 29, 1960,  MRN: [id number] HPI Chief Complaint  Patient presents with   Foot Pain    Plantar/medial heel bilateral (R>L) - aching x 6 weeks, some AM pain, tried Ibuprofen   New Patient (Initial Visit)    60 y.o. female presents with the above complaint.   ROS: Denies fever chills nausea vomiting muscle aches pains calf pain back pain chest pain shortness of breath.  Past Medical History:  Diagnosis Date   Degenerative disc disease, lumbar    History of endometriosis    and fibroids   History of scoliosis        History of smoking    Hot flashes    Vitamin D deficiency    Past Surgical History:  Procedure Laterality Date   LAPAROSCOPIC CHOLECYSTECTOMY  2004   Robotic Hysterectomy/BSO  05/2012   TONSILLECTOMY  1970    Current Outpatient Medications:    meloxicam (MOBIC) 15 MG tablet, Take 1 tablet (15 mg total) by mouth daily., Disp: 30 tablet, Rfl: 3   methylPREDNISolone (MEDROL DOSEPAK) 4 MG TBPK tablet, 6 day dose pack - take as directed, Disp: 21 tablet, Rfl: 0   calcium carbonate (OS-CAL - DOSED IN MG OF ELEMENTAL CALCIUM) 1250 (500 Ca) MG tablet, Take 1 tablet by mouth daily with breakfast., Disp: , Rfl:    cholecalciferol (VITAMIN D) 1000 units tablet, Take 1,000 Units by mouth daily., Disp: , Rfl:    Multiple Vitamins-Minerals (MULTIVITAMIN WITH MINERALS) tablet, Take 1 tablet by mouth daily., Disp: , Rfl:   Allergies  Allergen Reactions   Codeine Nausea And Vomiting   Review of Systems Objective:  There were no vitals filed for this visit.  General: Well developed, nourished, in no acute distress, alert and oriented x3   Dermatological: Skin is warm, dry and supple bilateral. Nails x 10 are well maintained; remaining integument appears unremarkable at this time. There are no open sores, no preulcerative lesions, no rash or signs of infection present.  Vascular: Dorsalis Pedis artery and Posterior  Tibial artery pedal pulses are 2/4 bilateral with immedate capillary fill time. Pedal hair growth present. No varicosities and no lower extremity edema present bilateral.   Neruologic: Grossly intact via light touch bilateral. Vibratory intact via tuning fork bilateral. Protective threshold with Semmes Wienstein monofilament intact to all pedal sites bilateral. Patellar and Achilles deep tendon reflexes 2+ bilateral. No Babinski or clonus noted bilateral.   Musculoskeletal: No gross boney pedal deformities bilateral. No pain, crepitus, or limitation noted with foot and ankle range of motion bilateral. Muscular strength 5/5 in all groups tested bilateral.  Pain on palpation medial calcaneal tubercles bilateral.  No pain on medial lateral compression.  Gait: Unassisted, Nonantalgic.    Radiographs:  Radiographs today taken demonstrate osteoarthritic changes bilateral foot no acute findings soft tissue increase in density plantar fascial kidney insertion site right heel greater than left.  Assessment & Plan:   Assessment: Planter fasciitis bilateral.  Plan: Start her on methylprednisolone to be followed by meloxicam.  Discussed appropriate shoe gear stretching exercise ice therapy shoe modifications.  Injected bilateral heels today 20 mg Kenalog 5 mg Marcaine and applied plantar fascial braces bilateral.     Deborra Phegley T. [place], [place]

## 2021-11-07 NOTE — Patient Instructions (Signed)

## 2021-12-10 ENCOUNTER — Ambulatory Visit: Payer: Self-pay | Admitting: Podiatry

## 2022-04-15 ENCOUNTER — Ambulatory Visit
Admission: EM | Admit: 2022-04-15 | Discharge: 2022-04-15 | Disposition: A | Discharge status: Home / Self Care | Payer: Self-pay | Attending: Family Medicine | Admitting: Family Medicine

## 2022-04-15 ENCOUNTER — Encounter: Payer: Self-pay | Admitting: Emergency Medicine

## 2022-04-15 ENCOUNTER — Other Ambulatory Visit: Payer: Self-pay

## 2022-04-15 DIAGNOSIS — J3089 Other allergic rhinitis: Secondary | ICD-10-CM

## 2022-04-15 DIAGNOSIS — J0100 Acute maxillary sinusitis, unspecified: Secondary | ICD-10-CM

## 2022-04-15 MED ORDER — AMOXICILLIN-POT CLAVULANATE 875-125 MG PO TABS: 1.0000 | ORAL_TABLET | Freq: Two times a day (BID) | ORAL | 0 refills | Status: DC

## 2022-04-15 MED ORDER — AZELASTINE HCL 0.1 % NA SOLN: 1.0000 | Freq: Two times a day (BID) | NASAL | 2 refills | Status: AC

## 2022-04-15 NOTE — ED Provider Notes (Signed)
[facility]    CSN: [id number] Arrival date & time: 04/15/22  4967      History   Chief Complaint Chief Complaint  Patient presents with   Nasal Congestion    HPI Shirley Conley is a 61 y.o. female.   Presenting today with 2-week history of progressively worsening nasal congestion, sinus pain and pressure, headache, cough, left ear pain and pressure, intermittent fevers.  Denies chest pain, shortness of breath, abdominal pain, nausea vomiting diarrhea, body aches.  So far trying Sudafed, antihistamines, sinus rinses with minimal relief.  Does have a history of seasonal allergies on prn antihistamines.  States she is tried Flonase in the past but it burns her nostrils.   Past Medical History:  Diagnosis Date   Degenerative disc disease, lumbar    History of endometriosis    and fibroids   History of scoliosis        History of smoking    Hot flashes    Vitamin D deficiency     Patient Active Problem List   Diagnosis Date Noted   Hot flashes     Past Surgical History:  Procedure Laterality Date   LAPAROSCOPIC CHOLECYSTECTOMY  2004   Robotic Hysterectomy/BSO  05/2012   TONSILLECTOMY  1970    OB History     Gravida  1   Para  1   Term      Preterm      AB      Living  1      SAB      IAB      Ectopic      Multiple      Live Births               Home Medications    Prior to Admission medications   Medication Sig Start Date End Date Taking? Authorizing Provider  amoxicillin-clavulanate (AUGMENTIN) 875-125 MG tablet Take 1 tablet by mouth every 12 (twelve) hours. 04/15/22  Yes Volney American, PA-C  azelastine (ASTELIN) 0.1 % nasal spray Place 1 spray into both nostrils 2 (two) times daily. Use in each nostril as directed 04/15/22  Yes Volney American, PA-C  calcium carbonate (OS-CAL - DOSED IN MG OF ELEMENTAL CALCIUM) 1250 (500 Ca) MG tablet Take 1 tablet by mouth daily with breakfast.    [provider]   cholecalciferol (VITAMIN D) 1000 units tablet Take 1,000 Units by mouth daily.    [provider]  meloxicam (MOBIC) 15 MG tablet Take 1 tablet (15 mg total) by mouth daily. 11/07/21   Hyatt, Max T, DPM  methylPREDNISolone (MEDROL DOSEPAK) 4 MG TBPK tablet 6 day dose pack - take as directed 11/07/21   Hyatt, Max T, DPM  Multiple Vitamins-Minerals (MULTIVITAMIN WITH MINERALS) tablet Take 1 tablet by mouth daily.    [provider]    Family History Family History  Problem Relation Age of Onset   Osteoporosis Mother    Diabetes Mother    Cancer Maternal Aunt        cervical   Diabetes Maternal Grandfather     Social History Social History   Tobacco Use   Smoking status: Former    Years: 20.00    Types: Cigarettes    Quit date: 04/17/2016    Years since quitting: 5.9   Smokeless tobacco: Never   Tobacco comments:    one pack/per week  Substance Use Topics   Alcohol use: No   Drug use: No  Allergies   Codeine   Review of Systems Review of Systems Per HPI  Physical Exam Triage Vital Signs ED Triage Vitals  Enc Vitals Group     BP 04/15/22 0833 133/75     Pulse Rate 04/15/22 0833 (!) 108     Resp 04/15/22 0833 18     Temp 04/15/22 0833 98.4 F (36.9 C)     Temp Source 04/15/22 0833 Oral     SpO2 04/15/22 0833 98 %     Weight 04/15/22 0835 145 lb (65.8 kg)     Height 04/15/22 0835 '5\' 1"'$  (1.549 m)     Head Circumference --      Peak Flow --      Pain Score 04/15/22 0834 7     Pain Loc --      Pain Edu? --      Excl. in [place]? --    No data found.  Updated Vital Signs BP 133/75 (BP Location: Right Arm)   Pulse (!) 108   Temp 98.4 F (36.9 C) (Oral)   Resp 18   Ht '5\' 1"'$  (1.549 m)   Wt 145 lb (65.8 kg)   LMP 05/17/2012   SpO2 98%   BMI 27.40 kg/m   Visual Acuity Right Eye Distance:   Left Eye Distance:   Bilateral Distance:    Right Eye Near:   Left Eye Near:    Bilateral Near:     Physical Exam Vitals and nursing note  reviewed.  Constitutional:      Appearance: Normal appearance.  HENT:     Head: Atraumatic.     Right Ear: Tympanic membrane and external ear normal.     Left Ear: Tympanic membrane and external ear normal.     Nose: Congestion present.     Mouth/Throat:     Mouth: Mucous membranes are moist.     Pharynx: Posterior oropharyngeal erythema present.  Eyes:     Extraocular Movements: Extraocular movements intact.     Conjunctiva/sclera: Conjunctivae normal.  Cardiovascular:     Rate and Rhythm: Normal rate and regular rhythm.     Heart sounds: Normal heart sounds.  Pulmonary:     Effort: Pulmonary effort is normal.     Breath sounds: Normal breath sounds. No wheezing or rales.  Musculoskeletal:        General: Normal range of motion.     Cervical back: Normal range of motion and neck supple.  Skin:    General: Skin is warm and dry.  Neurological:     Mental Status: She is alert and oriented to person, place, and time.  Psychiatric:        Mood and Affect: Mood normal.        Thought Content: Thought content normal.     UC Treatments / Results  Labs (all labs ordered are listed, but only abnormal results are displayed) Labs Reviewed - No data to display  EKG   Radiology No results found.  Procedures Procedures (including critical care time)  Medications Ordered in UC Medications - No data to display  Initial Impression / Assessment and Plan / UC Course  I have reviewed the triage vital signs and the nursing notes.  Pertinent labs & imaging results that were available during my care of the patient were reviewed by me and considered in my medical decision making (see chart for details).     Continue good allergy regimen, will treat with Augmentin and Astelin nasal spray for acute  maxillary sinusitis.  Discussed supportive over-the-counter medications and home care.  Return for worsening symptoms.  Final Clinical Impressions(s) / UC Diagnoses   Final diagnoses:   Acute non-recurrent maxillary sinusitis  Seasonal allergic rhinitis due to other allergic trigger     Discharge Instructions      Taking antihistamine such as Zyrtec and the nasal spray that I sent consistently to see if this helps keep your symptoms under better control from day-to-day.  I have also sent an antibiotic to treat the current sinus infection    ED Prescriptions     Medication Sig Dispense Auth. Provider   amoxicillin-clavulanate (AUGMENTIN) 875-125 MG tablet Take 1 tablet by mouth every 12 (twelve) hours. 14 tablet Volney American, [place]   azelastine (ASTELIN) 0.1 % nasal spray Place 1 spray into both nostrils 2 (two) times daily. Use in each nostril as directed 30 mL Volney American, PA-C      PDMP not reviewed this encounter.   Volney American, [place] 04/15/22 1655

## 2022-04-15 NOTE — Discharge Instructions (Signed)
Taking antihistamine such as Zyrtec and the nasal spray that I sent consistently to see if this helps keep your symptoms under better control from day-to-day.  I have also sent an antibiotic to treat the current sinus infection

## 2022-04-15 NOTE — ED Triage Notes (Addendum)
Pt reports nasal congestion,headache, cough, left ear pain x2 weeks. Pt reports has tried otc medication and home remedies with no relief. Pt also reports intermittent fever.  Pt reports wears hearing aids and reports was assessed at audiologist and was told had fluid in left ear.

## 2022-05-03 ENCOUNTER — Ambulatory Visit
Admission: EM | Admit: 2022-05-03 | Discharge: 2022-05-03 | Disposition: A | Discharge status: Home / Self Care | Payer: Self-pay | Attending: Family Medicine | Admitting: Family Medicine

## 2022-05-03 DIAGNOSIS — J0101 Acute recurrent maxillary sinusitis: Secondary | ICD-10-CM

## 2022-05-03 MED ORDER — LEVOFLOXACIN 750 MG PO TABS: 750.0000 mg | ORAL_TABLET | Freq: Every day | ORAL | 0 refills | Status: DC

## 2022-05-03 MED ORDER — PREDNISONE 20 MG PO TABS: 40.0000 mg | ORAL_TABLET | Freq: Every day | ORAL | 0 refills | Status: DC

## 2022-05-03 NOTE — ED Triage Notes (Signed)
Pt states she was here 2 weeks ago with a sinus infection and she thinks she still has it   Pt states she has extreme pain right ear, low grade fever, headache and clear mucus

## 2022-05-05 NOTE — ED Provider Notes (Signed)
[facility]   [id number] 05/03/22 Arrival Time: 9833  ASSESSMENT & PLAN:  1. Acute recurrent maxillary sinusitis    Begin: Meds ordered this encounter  Medications   predniSONE (DELTASONE) 20 MG tablet    Sig: Take 2 tablets (40 mg total) by mouth daily.    Dispense:  10 tablet    Refill:  0   levofloxacin (LEVAQUIN) 750 MG tablet    Sig: Take 1 tablet (750 mg total) by mouth daily.    Dispense:  7 tablet    Refill:  0    Discussed typical duration of symptoms. OTC symptom care as needed. Ensure adequate fluid intake and rest.   Follow-up Information     Everardo Beals, NP.   Why: As needed. Contact information: [place] [address] [phone]                 Reviewed expectations re: course of current medical issues. Questions answered. Outlined signs and symptoms indicating need for more acute intervention. Patient verbalized understanding. After Visit Summary given.   SUBJECTIVE: History from: patient.  Shirley Conley is a 61 y.o. female who presents with complaint of nasal congestion, post-nasal drainage, and sinus pain. Seen 2 w ago; "feels like it's still there." Pain over cheeks and below eyes. Afebrile. No resp symptoms.  Social History   Tobacco Use  Smoking Status Former   Years: 20.00   Types: Cigarettes   Quit date: 04/17/2016   Years since quitting: 6.0  Smokeless Tobacco Never  Tobacco Comments   one pack/per week    OBJECTIVE:  Vitals:   05/03/22 0843  BP: 129/80  Pulse: (!) 104  Resp: 20  Temp: 98.7 F (37.1 C)  TempSrc: Oral  SpO2: 98%    Slight tachycardia noted.  General appearance: alert; no distress HEENT: nasal congestion; clear runny nose; throat irritation secondary to post-nasal drainage; bilateral maxillary tenderness to palpation; turbinates boggy Neck: supple without LAD; trachea midline Lungs: unlabored respirations, symmetrical air entry; cough: absent; no  respiratory distress Skin: warm and dry Psychological: alert and cooperative; normal mood and affect  Allergies  Allergen Reactions   Codeine Nausea And Vomiting    Past Medical History:  Diagnosis Date   Degenerative disc disease, lumbar    History of endometriosis    and fibroids   History of scoliosis        History of smoking    Hot flashes    Vitamin D deficiency    Family History  Problem Relation Age of Onset   Osteoporosis Mother    Diabetes Mother    Cancer Maternal Aunt        cervical   Diabetes Maternal Grandfather    Social History   Socioeconomic History   Marital status: Married    Spouse name: Not on file   Number of children: Not on file   Years of education: Not on file   Highest education level: Not on file  Occupational History   Not on file  Tobacco Use   Smoking status: Former    Years: 20.00    Types: Cigarettes    Quit date: 04/17/2016    Years since quitting: 6.0   Smokeless tobacco: Never   Tobacco comments:    one pack/per week  Substance and Sexual Activity   Alcohol use: No   Drug use: No   Sexual activity: Yes    Partners: Male    Birth control/protection: Surgical, None  Comment: NONE; PT HAD HYSTERECTOMY  Other Topics Concern   Not on file  Social History Narrative   Not on file   Social Determinants of Health   Financial Resource Strain: Not on file  Food Insecurity: Not on file  Transportation Needs: Not on file  Physical Activity: Not on file  Stress: Not on file  Social Connections: Not on file  Intimate Partner Violence: Not on file             Vanessa Kick, MD 05/05/22 1054

## 2022-06-30 ENCOUNTER — Ambulatory Visit
Admission: EM | Admit: 2022-06-30 | Discharge: 2022-06-30 | Disposition: A | Discharge status: Home / Self Care | Payer: Self-pay

## 2022-06-30 DIAGNOSIS — M79641 Pain in right hand: Secondary | ICD-10-CM

## 2022-06-30 DIAGNOSIS — M79642 Pain in left hand: Secondary | ICD-10-CM

## 2022-06-30 NOTE — ED Provider Notes (Signed)
[facility]    CSN: [id number] Arrival date & time: 06/30/22  1729      History   Chief Complaint Chief Complaint  Patient presents with   Hand Problem    HPI Shirley Conley is a 61 y.o. female.   Patient presents with bilateral hand pain that has been worsening for the past month.  She reports the pain is in her first 2 knuckles on bilateral hands and they are red and swollen.  Reports the pain is worse in the morning.  She has taken Tylenol and Aleve which does help, however she does not want to keep taking this medicine because she does not want to damage her kidney or liver.    She denies any current pain, decreased range of motion, numbness or tingling, fevers, nausea/vomiting, bruising.  She denies any injury prior to the pain starting.  No accident or trauma to the hands.  Also reports the third finger of the left hand will not straighten all of the way.  Patient denies any significant medical history.  Reports she does not have a primary care provider because "I do not get sick."    Past Medical History:  Diagnosis Date   Degenerative disc disease, lumbar    History of endometriosis    and fibroids   History of scoliosis        History of smoking    Hot flashes    Vitamin D deficiency     Patient Active Problem List   Diagnosis Date Noted   Hot flashes     Past Surgical History:  Procedure Laterality Date   LAPAROSCOPIC CHOLECYSTECTOMY  2004   Robotic Hysterectomy/BSO  05/2012   TONSILLECTOMY  1970    OB History     Gravida  1   Para  1   Term      Preterm      AB      Living  1      SAB      IAB      Ectopic      Multiple      Live Births               Home Medications    Prior to Admission medications   Medication Sig Start Date End Date Taking? Authorizing Provider  azelastine (ASTELIN) 0.1 % nasal spray Place 1 spray into both nostrils 2 (two) times daily. Use in each nostril as directed 04/15/22   Volney American, PA-C  calcium carbonate (OS-CAL - DOSED IN MG OF ELEMENTAL CALCIUM) 1250 (500 Ca) MG tablet Take 1 tablet by mouth daily with breakfast.    [provider]  cholecalciferol (VITAMIN D) 1000 units tablet Take 1,000 Units by mouth daily.    [provider]  meloxicam (MOBIC) 15 MG tablet Take 1 tablet (15 mg total) by mouth daily. 11/07/21   Hyatt, Max T, DPM  Multiple Vitamins-Minerals (MULTIVITAMIN WITH MINERALS) tablet Take 1 tablet by mouth daily.    [provider]    Family History Family History  Problem Relation Age of Onset   Osteoporosis Mother    Diabetes Mother    Cancer Maternal Aunt        cervical   Diabetes Maternal Grandfather     Social History Social History   Tobacco Use   Smoking status: Former    Years: 20.00    Types: Cigarettes    Quit date: 04/17/2016    Years  since quitting: 6.2   Smokeless tobacco: Never   Tobacco comments:    one pack/per week  Substance Use Topics   Alcohol use: No   Drug use: No     Allergies   Codeine   Review of Systems Review of Systems Per HPI  Physical Exam Triage Vital Signs ED Triage Vitals  Enc Vitals Group     BP 06/30/22 1757 (!) 148/84     Pulse Rate 06/30/22 1757 76     Resp 06/30/22 1757 18     Temp 06/30/22 1757 98.1 F (36.7 C)     Temp src --      SpO2 06/30/22 1757 97 %     Weight --      Height --      Head Circumference --      Peak Flow --      Pain Score 06/30/22 1756 0     Pain Loc --      Pain Edu? --      Excl. in [place]? --    No data found.  Updated Vital Signs BP (!) 148/84   Pulse 76   Temp 98.1 F (36.7 C)   Resp 18   LMP 05/17/2012   SpO2 97%   Visual Acuity Right Eye Distance:   Left Eye Distance:   Bilateral Distance:    Right Eye Near:   Left Eye Near:    Bilateral Near:     Physical Exam Vitals and nursing note reviewed.  Constitutional:      General: She is not in acute distress.    Appearance: Normal appearance.  She is not toxic-appearing.  Pulmonary:     Effort: Pulmonary effort is normal. No respiratory distress.  Musculoskeletal:       Hands:     Comments: Inspection: Mild swelling to the metacarpal joints and areas marked; no obvious deformity or redness or bruising Palpation: Hand joints are nontender to palpation; no obvious deformities palpated ROM: Full ROM bilateral hands, wrists, fingers Strength: 5/5 bilateral upper extremities Neurovascular: neurovascularly intact in left and right upper extremity   Skin:    General: Skin is warm and dry.     Capillary Refill: Capillary refill takes less than 2 seconds.     Coloration: Skin is not jaundiced or pale.     Findings: No erythema.  Neurological:     Mental Status: She is alert and oriented to person, place, and time.  Psychiatric:        Behavior: Behavior is cooperative.      UC Treatments / Results  Labs (all labs ordered are listed, but only abnormal results are displayed) Labs Reviewed - No data to display  EKG   Radiology No results found.  Procedures Procedures (including critical care time)  Medications Ordered in UC Medications - No data to display  Initial Impression / Assessment and Plan / UC Course  I have reviewed the triage vital signs and the nursing notes.  Pertinent labs & imaging results that were available during my care of the patient were reviewed by me and considered in my medical decision making (see chart for details).    Patient is a 61 year old female presenting for bilateral hand pain today.  On examination, mild swelling of distal metacarpal bones appreciated.  I do not appreciate erythema or warmth.  She has full range of motion additionally.  Suspect tendon irritation of left third finger of hand.  Recommended establishing care with primary care provider  for further evaluation and work-up.  Patient declines initial blood work including CBC and CMET today.  She also declines a Toradol shot  today.  Primary care assistance initiated today. Final Clinical Impressions(s) / UC Diagnoses   Final diagnoses:  Bilateral hand pain     Discharge Instructions      - You can take Tylenol or Aleve for the hand pain since this has been working well - Recommend following up with a primary care doctor for further treatment and recommendations     ED Prescriptions   None    PDMP not reviewed this encounter.   Eulogio Bear, NP 06/30/22 1824

## 2022-06-30 NOTE — Discharge Instructions (Addendum)
-   You can take Tylenol or Aleve for the hand pain since this has been working well - Recommend following up with a primary care doctor for further treatment and recommendations

## 2022-06-30 NOTE — ED Triage Notes (Addendum)
Pt presents with complaints of bruising to the top of both hands, swelling, and on and off pain. Pt middle finger on left hand has limited range or motion. Pt denies any injury. Reports symptoms x 1 month.

## 2022-07-02 ENCOUNTER — Encounter: Payer: Self-pay | Admitting: Emergency Medicine

## 2022-07-14 ENCOUNTER — Ambulatory Visit (INDEPENDENT_AMBULATORY_CARE_PROVIDER_SITE_OTHER): Payer: Self-pay | Admitting: Podiatry

## 2022-07-14 ENCOUNTER — Ambulatory Visit (INDEPENDENT_AMBULATORY_CARE_PROVIDER_SITE_OTHER): Payer: Self-pay

## 2022-07-14 DIAGNOSIS — M722 Plantar fascial fibromatosis: Secondary | ICD-10-CM

## 2022-07-14 MED ORDER — MELOXICAM 15 MG PO TABS: 15.0000 mg | ORAL_TABLET | Freq: Every day | ORAL | 1 refills | Status: AC

## 2022-07-14 NOTE — Progress Notes (Signed)
General intermittent foot pain b/l. Today does not hurt - meloxicam. PRN. Cont good shoes    Chief Complaint  Patient presents with   Foot Pain    Patient is here for bilateral foot pain that she has had since the end of June, seems to be getting better.    HPI: 61 y.o. female presenting today as a new patient for evaluation of intermittent bilateral foot pain in different areas of the feet.  Patient states that today there is no pain associated to her feet.  She says the pain is intermittent.  She denies a history of injury.  She presents for further treatment and evaluation  Past Medical History:  Diagnosis Date   Degenerative disc disease, lumbar    History of endometriosis    and fibroids   History of scoliosis        History of smoking    Hot flashes    Vitamin D deficiency     Past Surgical History:  Procedure Laterality Date   LAPAROSCOPIC CHOLECYSTECTOMY  2004   Robotic Hysterectomy/BSO  05/2012   TONSILLECTOMY  1970    Allergies  Allergen Reactions   Codeine Nausea And Vomiting     Physical Exam: General: The patient is alert and oriented x3 in no acute distress.  Dermatology: Skin is warm, dry and supple bilateral lower extremities. Negative for open lesions or macerations.  Vascular: Palpable pedal pulses bilaterally. Capillary refill within normal limits.  Negative for any significant edema or erythema  Neurological: Light touch and protective threshold grossly intact  Musculoskeletal Exam: No pedal deformities noted.  Today there is no pain or tenderness throughout palpation of the bilateral foot or ankle.  Otherwise normal exam  Radiographic Exam:  Normal osseous mineralization. Joint spaces preserved. No fracture/dislocation/boney destruction.    Assessment: 1.  Generalized diffuse intermittent foot pain bilateral   Plan of Care:  1. Patient evaluated. X-Rays reviewed.  2.  Prescription for meloxicam 15 mg daily as needed 3.  Continue wearing  good supportive shoes and sneakers 4.  Return to clinic as needed      Edrick Kins, DPM [facility]  Dr. Edrick Kins, DPM    [address], [place] [address]                Office [phone]  Fax [phone]

## 2023-01-15 ENCOUNTER — Encounter: Payer: Self-pay | Admitting: Radiology

## 2023-04-18 DIAGNOSIS — Z419 Encounter for procedure for purposes other than remedying health state, unspecified: Secondary | ICD-10-CM | POA: Diagnosis not present

## 2023-05-18 DIAGNOSIS — Z419 Encounter for procedure for purposes other than remedying health state, unspecified: Secondary | ICD-10-CM | POA: Diagnosis not present

## 2023-06-18 DIAGNOSIS — Z419 Encounter for procedure for purposes other than remedying health state, unspecified: Secondary | ICD-10-CM | POA: Diagnosis not present

## 2023-07-19 DIAGNOSIS — Z419 Encounter for procedure for purposes other than remedying health state, unspecified: Secondary | ICD-10-CM | POA: Diagnosis not present

## 2023-09-14 ENCOUNTER — Ambulatory Visit
Admission: RE | Admit: 2023-09-14 | Discharge: 2023-09-14 | Disposition: A | Discharge status: Home / Self Care | Payer: Medicaid Other | Source: Ambulatory Visit | Attending: Nurse Practitioner | Admitting: Nurse Practitioner

## 2023-09-14 VITALS — BP 138/77 | HR 81 | Temp 98.2°F | Resp 17

## 2023-09-14 DIAGNOSIS — J019 Acute sinusitis, unspecified: Secondary | ICD-10-CM | POA: Diagnosis not present

## 2023-09-14 DIAGNOSIS — H1031 Unspecified acute conjunctivitis, right eye: Secondary | ICD-10-CM

## 2023-09-14 DIAGNOSIS — B9689 Other specified bacterial agents as the cause of diseases classified elsewhere: Secondary | ICD-10-CM

## 2023-09-14 MED ORDER — AMOXICILLIN-POT CLAVULANATE 875-125 MG PO TABS: 1.0000 | ORAL_TABLET | Freq: Two times a day (BID) | ORAL | 0 refills | Status: AC

## 2023-09-14 MED ORDER — ERYTHROMYCIN 5 MG/GM OP OINT
TOPICAL_OINTMENT | Freq: Four times a day (QID) | OPHTHALMIC | 0 refills | Status: AC
Start: 2023-09-14 — End: 2023-09-19

## 2023-09-14 NOTE — ED Triage Notes (Signed)
Pt c/o eye problem. Pt states she has an infection in her eye with crusting that is yellow and then has sinus pressure from her face to her teeth.

## 2023-09-14 NOTE — ED Provider Notes (Signed)
[facility]    CSN: [id number] Arrival date & time: 09/14/23  1423      History   Chief Complaint No chief complaint on file.   HPI Shirley Conley is a 62 y.o. female.   Patient presents today with 7-day history of dry cough, right-sided nasal congestion and runny nose, headache on the right side of her face, drainage from the right eye with matted eye in the morning and thick drainage coming from the eye with some watering as well.  Reports is a little bit itchy in the morning but that gets better throughout the day.  No eye redness, floaters in the vision, blurred or double vision.  No known sick contacts.  Denies fever, bodyaches or chills, shortness of breath or chest pain, chest tightness, sore throat, ear pain, abdominal pain, nausea/vomiting, or diarrhea.  No change in appetite or change in energy levels.  Has been taking ibuprofen for the headache, Zyrtec, and using warm compresses for the eye without much benefit.    Past Medical History:  Diagnosis Date   Degenerative disc disease, lumbar    History of endometriosis    and fibroids   History of scoliosis        History of smoking    Hot flashes    Vitamin D deficiency     Patient Active Problem List   Diagnosis Date Noted   Hot flashes     Past Surgical History:  Procedure Laterality Date   LAPAROSCOPIC CHOLECYSTECTOMY  2004   Robotic Hysterectomy/BSO  05/2012   TONSILLECTOMY  1970    OB History     Gravida  1   Para  1   Term      Preterm      AB      Living  1      SAB      IAB      Ectopic      Multiple      Live Births               Home Medications    Prior to Admission medications   Medication Sig Start Date End Date Taking? Authorizing Provider  amoxicillin-clavulanate (AUGMENTIN) 875-125 MG tablet Take 1 tablet by mouth every 12 (twelve) hours. 09/14/23  Yes Valentino Nose, NP  erythromycin ophthalmic ointment Place into the right eye 4 (four) times  daily for 5 days. Place a 1/2 inch ribbon of ointment into the lower eyelid. 09/14/23 09/19/23 Yes Cathlean Marseilles A, NP  azelastine (ASTELIN) 0.1 % nasal spray Place 1 spray into both nostrils 2 (two) times daily. Use in each nostril as directed 04/15/22   Particia Nearing, PA-C  calcium carbonate (OS-CAL - DOSED IN MG OF ELEMENTAL CALCIUM) 1250 (500 Ca) MG tablet Take 1 tablet by mouth daily with breakfast.    [provider]  cholecalciferol (VITAMIN D) 1000 units tablet Take 1,000 Units by mouth daily.    [provider]  meloxicam (MOBIC) 15 MG tablet Take 1 tablet (15 mg total) by mouth daily. 07/14/22   Felecia Shelling, DPM  Multiple Vitamins-Minerals (MULTIVITAMIN WITH MINERALS) tablet Take 1 tablet by mouth daily.    [provider]    Family History Family History  Problem Relation Age of Onset   Osteoporosis Mother    Diabetes Mother    Cancer Maternal Aunt        cervical   Diabetes Maternal Grandfather     Social  History Social History   Tobacco Use   Smoking status: Former    Current packs/day: 0.00    Types: Cigarettes    Start date: 04/17/1996    Quit date: 04/17/2016    Years since quitting: 7.4   Smokeless tobacco: Never   Tobacco comments:    one pack/per week  Substance Use Topics   Alcohol use: No   Drug use: No     Allergies   Cipro [ciprofloxacin hcl], Codeine, and Levofloxacin   Review of Systems Review of Systems Per HPI  Physical Exam Triage Vital Signs ED Triage Vitals  Encounter Vitals Group     BP 09/14/23 1443 138/77     Systolic BP Percentile --      Diastolic BP Percentile --      Pulse Rate 09/14/23 1443 81     Resp 09/14/23 1443 17     Temp 09/14/23 1443 98.2 F (36.8 C)     Temp Source 09/14/23 1443 Oral     SpO2 09/14/23 1443 94 %     Weight --      Height --      Head Circumference --      Peak Flow --      Pain Score 09/14/23 1446 0     Pain Loc --      Pain Education --      Exclude from  Growth Chart --    No data found.  Updated Vital Signs BP 138/77 (BP Location: Right Arm)   Pulse 81   Temp 98.2 F (36.8 C) (Oral)   Resp 17   LMP 05/17/2012   SpO2 94%   Visual Acuity Right Eye Distance:   Left Eye Distance:   Bilateral Distance:    Right Eye Near:   Left Eye Near:    Bilateral Near:     Physical Exam Vitals and nursing note reviewed.  Constitutional:      General: She is not in acute distress.    Appearance: Normal appearance. She is not ill-appearing or toxic-appearing.  HENT:     Head: Normocephalic and atraumatic.     Right Ear: Tympanic membrane, ear canal and external ear normal.     Left Ear: Tympanic membrane, ear canal and external ear normal.     Nose: No congestion or rhinorrhea.     Right Sinus: Maxillary sinus tenderness present. No frontal sinus tenderness.     Left Sinus: No maxillary sinus tenderness or frontal sinus tenderness.     Mouth/Throat:     Mouth: Mucous membranes are moist.     Pharynx: Oropharynx is clear. Posterior oropharyngeal erythema present. No oropharyngeal exudate.  Eyes:     General: No scleral icterus.    Extraocular Movements: Extraocular movements intact.     Conjunctiva/sclera: Conjunctivae normal.     Right eye: Right conjunctiva is not injected. No chemosis or exudate.    Pupils: Pupils are equal, round, and reactive to light.     Comments: Clear watery discharge from right eye  Cardiovascular:     Rate and Rhythm: Normal rate and regular rhythm.  Pulmonary:     Effort: Pulmonary effort is normal. No respiratory distress.     Breath sounds: Normal breath sounds. No wheezing, rhonchi or rales.  Musculoskeletal:     Cervical back: Normal range of motion and neck supple.  Lymphadenopathy:     Cervical: No cervical adenopathy.  Skin:    General: Skin is warm and dry.  Capillary Refill: Capillary refill takes less than 2 seconds.     Coloration: Skin is not jaundiced or pale.     Findings: No erythema  or rash.  Neurological:     Mental Status: She is alert and oriented to person, place, and time.  Psychiatric:        Behavior: Behavior is cooperative.      UC Treatments / Results  Labs (all labs ordered are listed, but only abnormal results are displayed) Labs Reviewed - No data to display  EKG   Radiology No results found.  Procedures Procedures (including critical care time)  Medications Ordered in UC Medications - No data to display  Initial Impression / Assessment and Plan / UC Course  I have reviewed the triage vital signs and the nursing notes.  Pertinent labs & imaging results that were available during my care of the patient were reviewed by me and considered in my medical decision making (see chart for details).   Patient is well-appearing, normotensive, afebrile, not tachycardic, not tachypneic, oxygenating well on room air.    1. Acute bacterial sinusitis Treat with Augmentin twice daily for 7 days Other supportive care discussed with patient including guaifenesin, running a humidifier  2. Acute conjunctivitis of right eye, unspecified acute conjunctivitis type Treat for bacterial conjunctivitis with erythromycin ointment Hand hygiene discussed Continue warm compresses Seek care for persistent/worsening symptoms despite treatment  The patient was given the opportunity to ask questions.  All questions answered to their satisfaction.  The patient is in agreement to this plan.    Final Clinical Impressions(s) / UC Diagnoses   Final diagnoses:  Acute bacterial sinusitis  Acute conjunctivitis of right eye, unspecified acute conjunctivitis type     Discharge Instructions      Take the Augmentin as prescribed to treat sinus infection.  Continue drinking plenty of water and you can taken ibuprofen as needed for pain.  Start taking Mucinex 600 mg twice daily for congestion.   You can use the erythromycin ointment to your right eye to help with infection.   Continue warm compresses.    ED Prescriptions     Medication Sig Dispense Auth. Provider   amoxicillin-clavulanate (AUGMENTIN) 875-125 MG tablet Take 1 tablet by mouth every 12 (twelve) hours. 14 tablet Cathlean Marseilles A, NP   erythromycin ophthalmic ointment Place into the right eye 4 (four) times daily for 5 days. Place a 1/2 inch ribbon of ointment into the lower eyelid. 3.5 g Valentino Nose, NP      PDMP not reviewed this encounter.   Valentino Nose, NP 09/14/23 [phone]

## 2023-09-14 NOTE — Discharge Instructions (Signed)
Take the Augmentin as prescribed to treat sinus infection.  Continue drinking plenty of water and you can taken ibuprofen as needed for pain.  Start taking Mucinex 600 mg twice daily for congestion.   You can use the erythromycin ointment to your right eye to help with infection.  Continue warm compresses.

## 2023-09-18 DIAGNOSIS — Z419 Encounter for procedure for purposes other than remedying health state, unspecified: Secondary | ICD-10-CM | POA: Diagnosis not present

## 2023-10-18 DIAGNOSIS — Z419 Encounter for procedure for purposes other than remedying health state, unspecified: Secondary | ICD-10-CM | POA: Diagnosis not present

## 2023-10-22 ENCOUNTER — Ambulatory Visit: Payer: Medicaid Other | Admitting: Family Medicine

## 2023-11-18 DIAGNOSIS — Z419 Encounter for procedure for purposes other than remedying health state, unspecified: Secondary | ICD-10-CM | POA: Diagnosis not present

## 2023-12-19 DIAGNOSIS — Z419 Encounter for procedure for purposes other than remedying health state, unspecified: Secondary | ICD-10-CM | POA: Diagnosis not present

## 2024-01-16 DIAGNOSIS — Z419 Encounter for procedure for purposes other than remedying health state, unspecified: Secondary | ICD-10-CM | POA: Diagnosis not present

## 2024-02-27 DIAGNOSIS — Z419 Encounter for procedure for purposes other than remedying health state, unspecified: Secondary | ICD-10-CM | POA: Diagnosis not present

## 2024-03-28 DIAGNOSIS — Z419 Encounter for procedure for purposes other than remedying health state, unspecified: Secondary | ICD-10-CM | POA: Diagnosis not present

## 2024-04-28 DIAGNOSIS — Z419 Encounter for procedure for purposes other than remedying health state, unspecified: Secondary | ICD-10-CM | POA: Diagnosis not present

## 2024-05-28 DIAGNOSIS — Z419 Encounter for procedure for purposes other than remedying health state, unspecified: Secondary | ICD-10-CM | POA: Diagnosis not present

## 2024-06-28 DIAGNOSIS — Z419 Encounter for procedure for purposes other than remedying health state, unspecified: Secondary | ICD-10-CM | POA: Diagnosis not present

## 2024-07-29 DIAGNOSIS — Z419 Encounter for procedure for purposes other than remedying health state, unspecified: Secondary | ICD-10-CM | POA: Diagnosis not present

## 2024-09-23 DIAGNOSIS — M25522 Pain in left elbow: Secondary | ICD-10-CM | POA: Diagnosis not present

## 2024-09-28 DIAGNOSIS — Z419 Encounter for procedure for purposes other than remedying health state, unspecified: Secondary | ICD-10-CM | POA: Diagnosis not present
